# Patient Record
Sex: Male | Born: 1975 | Race: White | Hispanic: No | Marital: Married | Smoking: Former smoker
Health system: Southern US, Community
[De-identification: ages and names within clinical notes are randomized; demographics above are authoritative.]

## PROBLEM LIST (undated history)

## (undated) DIAGNOSIS — J45909 Unspecified asthma, uncomplicated: Secondary | ICD-10-CM

## (undated) DIAGNOSIS — K219 Gastro-esophageal reflux disease without esophagitis: Secondary | ICD-10-CM

## (undated) DIAGNOSIS — T7840XA Allergy, unspecified, initial encounter: Secondary | ICD-10-CM

## (undated) DIAGNOSIS — G8929 Other chronic pain: Secondary | ICD-10-CM

## (undated) HISTORY — DX: Allergy, unspecified, initial encounter: T78.40XA

## (undated) HISTORY — DX: Unspecified asthma, uncomplicated: J45.909

## (undated) HISTORY — PX: HERNIA REPAIR: SHX51

## (undated) HISTORY — DX: Other chronic pain: G89.29

## (undated) HISTORY — DX: Gastro-esophageal reflux disease without esophagitis: K21.9

## (undated) HISTORY — PX: EYE SURGERY: SHX253

---

## 2016-02-21 ENCOUNTER — Encounter: Payer: Self-pay | Admitting: Family Medicine

## 2016-02-21 ENCOUNTER — Ambulatory Visit (INDEPENDENT_AMBULATORY_CARE_PROVIDER_SITE_OTHER): Payer: BLUE CROSS/BLUE SHIELD | Admitting: Family Medicine

## 2016-02-21 VITALS — BP 116/80 | HR 89 | Temp 98.5°F | Ht 72.0 in | Wt 189.8 lb

## 2016-02-21 DIAGNOSIS — L255 Unspecified contact dermatitis due to plants, except food: Secondary | ICD-10-CM | POA: Diagnosis not present

## 2016-02-21 DIAGNOSIS — Z7189 Other specified counseling: Secondary | ICD-10-CM

## 2016-02-21 DIAGNOSIS — Z7689 Persons encountering health services in other specified circumstances: Secondary | ICD-10-CM

## 2016-02-21 MED ORDER — PREDNISONE 20 MG PO TABS: ORAL_TABLET | ORAL | Status: DC

## 2016-02-21 NOTE — Patient Instructions (Signed)
You appear to have poison ivy or anther type of plant dermatitis Use the prednisone as directed. You can continue to use calamine or topical steroid creams, benadryl and zyrtec as needed Let me know if you are not improving in the next couple of days- Sooner if worse.   Take zyrtec in the am and benadryl before bed- using these at the same time may cause more sedation then using them separately

## 2016-02-21 NOTE — Progress Notes (Signed)
Pre visit review using our clinic tool,if applicable. No additional management support is needed unless otherwise documented below in the visit note.  

## 2016-02-21 NOTE — Progress Notes (Signed)
[place] at [facility] [address] [place], [place] [address] [phone]  Date:  02/21/2016   Name:  Shawn Robbins   DOB:  Oct 15, 1976   MRN:  [id number]  PCP:  Lamar Blinks, MD    Chief Complaint: Establish Care   History of Present Illness:  Shawn Robbins is a 40 y.o. very pleasant male patient who presents with the following:  Generally healthy with history of GERD, EIA.   His heartburn started 3-4 years ago- he changed his diet a lot and his sx remitted.  He does take prevacid daily still.    He is from [place], moved to [facility] [place] [facility] about 9 years ago and is now in [place]. Most of his family has joined him here He has not had a PCP in some time but his insurance has changed so he will come here now.   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    His main concern today is a rash- he did yardwork over the weekend and pulled some vines. 2 days later he noted onset of a rash on his right arm, now has spread to his neck and trunk.  He otherwise feels well- no fever, no SOB, no lip or tongue swelling, no wheezing  He does take zyrtec daily for AR   There are no active problems to display for this patient.   Past Medical History  Diagnosis Date  . Allergy   . Asthma     Athletic induced  . GERD (gastroesophageal reflux disease)     Past Surgical History  Procedure Laterality Date  . Hernia repair    . Eye surgery      Left eye as a child    Social History  Substance Use Topics  . Smoking status: Never Smoker   . Smokeless tobacco: Never Used  . Alcohol Use: 0.0 oz/week    0 Standard drinks or equivalent per week    Family History  Problem Relation Age of Onset  . Heart disease Mother   . Muscular dystrophy Mother   . Asthma Mother   . Diabetes Mother   . Cancer Father   . Parkinson's disease Father     No Known Allergies  Medication list has been reviewed and updated.  No current outpatient  prescriptions on file prior to visit.   No current facility-administered medications on file prior to visit.    Review of Systems:  As per HPI- otherwise negative. The rash is not painful It does itch enough that he wants to use prednisone to try and clear this up    Physical Examination: Filed Vitals:   02/21/16 1316  BP: 116/80  Pulse: 89  Temp: 98.5 F (36.9 C)   Filed Vitals:   02/21/16 1316  Height: 6' (1.829 m)  Weight: 189 lb 12.8 oz (86.093 kg)   Body mass index is 25.74 kg/(m^2). Ideal Body Weight: Weight in (lb) to have BMI = 25: 183.9  GEN: WDWN, NAD, Non-toxic, A & O x 3, looks well HEENT: Atraumatic, Normocephalic. Neck supple. No masses, No LAD.  Bilateral TM wnl, oropharynx normal.  PEERL,EOMI.   Ears and Nose: No external deformity. CV: RRR, No M/G/R. No JVD. No thrill. No extra heart sounds. PULM: CTA B, no wheezes, crackles, rhonchi. No retractions. No resp. distress. No accessory muscle use. EXTR: No c/c/e NEURO Normal gait.  PSYCH: Normally interactive. Conversant. Not depressed or  anxious appearing.  Calm demeanor.  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 He has a slightly more diffuse fine vesicular rash on his abdomen but this also does appear to be rhus derm  Assessment and Plan: Rhus dermatitis - Plan: predniSONE (DELTASONE) 20 MG tablet  Establishing care with new doctor, encounter for  Treat for symptomatic rhus derm with prednisone and antihistamines, topicals as needed He will see [place] for a CPE soon  See patient instructions for more details.    Meds ordered this encounter  Medications  . predniSONE (DELTASONE) 20 MG tablet    Sig: Take 2 pills a day for 5 days, then take 1 pill a day for 5 days    Dispense:  15 tablet    Refill:  0     Signed Lamar Blinks, MD

## 2016-08-13 ENCOUNTER — Ambulatory Visit (INDEPENDENT_AMBULATORY_CARE_PROVIDER_SITE_OTHER): Payer: BLUE CROSS/BLUE SHIELD | Admitting: Family Medicine

## 2016-08-13 ENCOUNTER — Encounter: Payer: Self-pay | Admitting: Family Medicine

## 2016-08-13 VITALS — BP 117/85 | HR 83 | Temp 98.9°F | Ht 72.0 in | Wt 187.0 lb

## 2016-08-13 DIAGNOSIS — H9209 Otalgia, unspecified ear: Secondary | ICD-10-CM | POA: Diagnosis not present

## 2016-08-13 DIAGNOSIS — R05 Cough: Secondary | ICD-10-CM | POA: Diagnosis not present

## 2016-08-13 DIAGNOSIS — J029 Acute pharyngitis, unspecified: Secondary | ICD-10-CM

## 2016-08-13 DIAGNOSIS — R058 Other specified cough: Secondary | ICD-10-CM

## 2016-08-13 LAB — CBC WITH DIFFERENTIAL/PLATELET
BASOS ABS: 0 10*3/uL (ref 0.0–0.1)
Basophils Relative: 0.6 % (ref 0.0–3.0)
EOS ABS: 0 10*3/uL (ref 0.0–0.7)
Eosinophils Relative: 0.6 % (ref 0.0–5.0)
HCT: 47.7 % (ref 39.0–52.0)
HEMOGLOBIN: 16.5 g/dL (ref 13.0–17.0)
Lymphocytes Relative: 39.5 % (ref 12.0–46.0)
Lymphs Abs: 1.9 10*3/uL (ref 0.7–4.0)
MCHC: 34.5 g/dL (ref 30.0–36.0)
MCV: 91.6 fl (ref 78.0–100.0)
Monocytes Absolute: 0.7 10*3/uL (ref 0.1–1.0)
Monocytes Relative: 14.7 % — ABNORMAL HIGH (ref 3.0–12.0)
Neutro Abs: 2.2 10*3/uL (ref 1.4–7.7)
Neutrophils Relative %: 44.6 % (ref 43.0–77.0)
PLATELETS: 278 10*3/uL (ref 150.0–400.0)
RBC: 5.21 Mil/uL (ref 4.22–5.81)
RDW: 13.1 % (ref 11.5–15.5)
WBC: 4.9 10*3/uL (ref 4.0–10.5)

## 2016-08-13 LAB — POCT RAPID STREP A (OFFICE): Rapid Strep A Screen: NEGATIVE

## 2016-08-13 NOTE — Progress Notes (Signed)
[place] at [facility] [address] [place], [place] [address] [phone]  Date:  08/13/2016   Name:  Shawn Robbins   DOB:  June 20, 1976   MRN:  [id number]  PCP:  Lamar Blinks, MD    Chief Complaint: Otalgia (c/o bilateral ear pressure, mostly dry cough, sore throat, head pressure and mild headache and hoarseness. Sx's started this past Sunday. )   History of Present Illness:  Shawn Robbins is a 40 y.o. very pleasant male patient who presents with the following:  History of allergies and occasional asthma sx Here today with illness.  He first noted ear pressure and ST.  The ST went away, but he continued to have ear pain, cough, hoarse voice. He had a low grade temp yesterday to 99.8.   The left ear is more painful at this time His cough is dry He does have a history of seasonal allergies No aches or chills No GI symptoms.   He has tried some zyrtec, flonase, he takes prevacid.  No wheezing.  They are leaving for a vacation this weekend- will be camping.   His wife reports that he coughed a lot last night but he slept through it.    There are no active problems to display for this patient.   Past Medical History:  Diagnosis Date  . Allergy   . Asthma    Athletic induced  . GERD (gastroesophageal reflux disease)     Past Surgical History:  Procedure Laterality Date  . EYE SURGERY     Left eye as a child  . HERNIA REPAIR      Social History  Substance Use Topics  . Smoking status: Former Research scientist (life sciences)  . Smokeless tobacco: Never Used  . Alcohol use 0.0 oz/week    Family History  Problem Relation Age of Onset  . Heart disease Mother   . Muscular dystrophy Mother   . Asthma Mother   . Diabetes Mother   . Cancer Father   . Parkinson's disease Father     No Known Allergies  Medication list has been reviewed and updated.  No current outpatient prescriptions on file prior to visit.   No current  facility-administered medications on file prior to visit.     Review of Systems:  As per HPI- otherwise negative.   Physical Examination: Vitals:   08/13/16 1028  BP: 117/85  Pulse: 83  Temp: 98.9 F (37.2 C)   Vitals:   08/13/16 1028  Weight: 187 lb (84.8 kg)  Height: 6' (1.829 m)   Body mass index is 25.36 kg/m. Ideal Body Weight: Weight in (lb) to have BMI = 25: 183.9  GEN: WDWN, NAD, Non-toxic, A & O x 3, looks well HEENT: Atraumatic, Normocephalic. Neck supple. No masses, No LAD.  Bilateral TM wnl, oropharynx erythematous without any exudate.  PEERL,EOMI.   Ears are clear Ears and Nose: No external deformity. CV: RRR, No M/G/R. No JVD. No thrill. No extra heart sounds. PULM: CTA B, no wheezes, crackles, rhonchi. No retractions. No resp. distress. No accessory muscle use. EXTR: No c/c/e NEURO Normal gait.  PSYCH: Normally interactive. Conversant. Not depressed or anxious appearing.  Calm demeanor.   Results for orders placed or performed in visit on 08/13/16  POCT rapid strep A  Result Value Ref Range   Rapid Strep A Screen Negative Negative    Assessment and Plan: Acute pharyngitis, unspecified etiology - Plan: POCT rapid strep A, CBC with  Differential/Platelet  Dry cough - Plan: CBC with Differential/Platelet  Earache - Plan: CBC with Differential/Platelet  Here today with a likely viral illness.  He is going on a camping trip tomorrow however so we will check a CBC for any evidence of a bacterial infection  Signed Lamar Blinks, MD  Received his CBC and gave him a call- this does appear to be viral. Continue supportive measures and call me if any worsening or other concerns, or if not improving soon  Results for orders placed or performed in visit on 08/13/16  CBC with Differential/Platelet  Result Value Ref Range   WBC 4.9 4.0 - 10.5 K/uL   RBC 5.21 4.22 - 5.81 Mil/uL   Hemoglobin 16.5 13.0 - 17.0 g/dL   HCT 47.7 39.0 - 52.0 %   MCV 91.6 78.0 -  100.0 fl   MCHC 34.5 30.0 - 36.0 g/dL   RDW 13.1 11.5 - 15.5 %   Platelets 278.0 150.0 - 400.0 K/uL   Neutrophils Relative % 44.6 43.0 - 77.0 %   Lymphocytes Relative 39.5 12.0 - 46.0 %   Monocytes Relative 14.7 (H) 3.0 - 12.0 %   Eosinophils Relative 0.6 0.0 - 5.0 %   Basophils Relative 0.6 0.0 - 3.0 %   Neutro Abs 2.2 1.4 - 7.7 K/uL   Lymphs Abs 1.9 0.7 - 4.0 K/uL   Monocytes Absolute 0.7 0.1 - 1.0 K/uL   Eosinophils Absolute 0.0 0.0 - 0.7 K/uL   Basophils Absolute 0.0 0.0 - 0.1 K/uL  POCT rapid strep A  Result Value Ref Range   Rapid Strep A Screen Negative Negative

## 2016-08-13 NOTE — Patient Instructions (Signed)
I think that you have a viral illness, but we will do a blood count to ensure no sign of a bacterial infection prior to your trip.  I'll be in touch with this later on today

## 2016-08-13 NOTE — Progress Notes (Signed)
Pre visit review using our clinic review tool, if applicable. No additional management support is needed unless otherwise documented below in the visit note. 

## 2016-10-30 ENCOUNTER — Other Ambulatory Visit: Payer: Self-pay | Admitting: Family Medicine

## 2016-10-30 ENCOUNTER — Ambulatory Visit (INDEPENDENT_AMBULATORY_CARE_PROVIDER_SITE_OTHER): Payer: BLUE CROSS/BLUE SHIELD | Admitting: Family Medicine

## 2016-10-30 ENCOUNTER — Encounter: Payer: Self-pay | Admitting: Family Medicine

## 2016-10-30 ENCOUNTER — Ambulatory Visit (HOSPITAL_BASED_OUTPATIENT_CLINIC_OR_DEPARTMENT_OTHER)
Admission: RE | Admit: 2016-10-30 | Discharge: 2016-10-30 | Disposition: A | Discharge status: Home / Self Care | Payer: BLUE CROSS/BLUE SHIELD | Source: Ambulatory Visit | Attending: Family Medicine | Admitting: Family Medicine

## 2016-10-30 VITALS — BP 129/86 | HR 94 | Temp 98.5°F | Ht 72.0 in | Wt 198.4 lb

## 2016-10-30 DIAGNOSIS — G8929 Other chronic pain: Secondary | ICD-10-CM | POA: Insufficient documentation

## 2016-10-30 DIAGNOSIS — M25641 Stiffness of right hand, not elsewhere classified: Secondary | ICD-10-CM

## 2016-10-30 DIAGNOSIS — Z23 Encounter for immunization: Secondary | ICD-10-CM | POA: Diagnosis not present

## 2016-10-30 DIAGNOSIS — Z131 Encounter for screening for diabetes mellitus: Secondary | ICD-10-CM | POA: Diagnosis not present

## 2016-10-30 DIAGNOSIS — R109 Unspecified abdominal pain: Secondary | ICD-10-CM

## 2016-10-30 DIAGNOSIS — M2560 Stiffness of unspecified joint, not elsewhere classified: Secondary | ICD-10-CM | POA: Diagnosis not present

## 2016-10-30 DIAGNOSIS — M2352 Chronic instability of knee, left knee: Secondary | ICD-10-CM | POA: Diagnosis not present

## 2016-10-30 DIAGNOSIS — Z13220 Encounter for screening for lipoid disorders: Secondary | ICD-10-CM

## 2016-10-30 DIAGNOSIS — R740 Nonspecific elevation of levels of transaminase and lactic acid dehydrogenase [LDH]: Secondary | ICD-10-CM

## 2016-10-30 DIAGNOSIS — M25562 Pain in left knee: Secondary | ICD-10-CM

## 2016-10-30 DIAGNOSIS — R635 Abnormal weight gain: Secondary | ICD-10-CM

## 2016-10-30 DIAGNOSIS — R7401 Elevation of levels of liver transaminase levels: Secondary | ICD-10-CM

## 2016-10-30 LAB — TSH: TSH: 1.13 u[IU]/mL (ref 0.35–4.50)

## 2016-10-30 LAB — LIPID PANEL
CHOL/HDL RATIO: 5
CHOLESTEROL: 268 mg/dL — AB (ref 0–200)
HDL: 54.8 mg/dL (ref >39.00)
LDL CALC: 181 mg/dL — AB (ref 0–99)
NonHDL: 212.8
Triglycerides: 158 mg/dL — ABNORMAL HIGH (ref 0.0–149.0)
VLDL: 31.6 mg/dL (ref 0.0–40.0)

## 2016-10-30 LAB — COMPREHENSIVE METABOLIC PANEL
ALBUMIN: 4.6 g/dL (ref 3.5–5.2)
ALT: 200 U/L — ABNORMAL HIGH (ref 0–53)
AST: 95 U/L — AB (ref 0–37)
Alkaline Phosphatase: 124 U/L — ABNORMAL HIGH (ref 39–117)
BUN: 12 mg/dL (ref 6–23)
CHLORIDE: 103 meq/L (ref 96–112)
CO2: 32 meq/L (ref 19–32)
CREATININE: 1.14 mg/dL (ref 0.40–1.50)
Calcium: 9.5 mg/dL (ref 8.4–10.5)
GFR: 75.24 mL/min (ref >60.00)
Glucose, Bld: 92 mg/dL (ref 70–99)
POTASSIUM: 4 meq/L (ref 3.5–5.1)
SODIUM: 141 meq/L (ref 135–145)
Total Bilirubin: 0.7 mg/dL (ref 0.2–1.2)
Total Protein: 7.2 g/dL (ref 6.0–8.3)

## 2016-10-30 LAB — SEDIMENTATION RATE: Sed Rate: 2 mm/hr (ref 0–15)

## 2016-10-30 LAB — C-REACTIVE PROTEIN: CRP: 0.1 mg/dL — AB (ref 0.5–20.0)

## 2016-10-30 NOTE — Patient Instructions (Signed)
Please go to lab and then to x-ray (on the ground floor, get off elevator and turn left) I will be in touch with your labs and x-rays asap Assuming your knee film looks ok we might try some physical therapy for knee strength and stability.  If symptoms persist an MRI would likely be the next step- however you would only want to have an MRI if your symptoms are to the point that you would consider surgery  We will get plain films of your right hand- if these are normal and there is no sign of an auto-immune disorder I plan to have you see a hand specialist. Your palmar fascia seems tight and I want to make sure you are not developing a contracture of your hand.  They may also want to test for entrapment of your ulnar nerve  Finally your abdominal wall pain is likely muscular in origin.  If worsening please let me know. Otherwise core strengthening like planks may be helpful in getting rid of this symptom.   Will also make sure that your thyroid is normal due to weight gain

## 2016-10-30 NOTE — Progress Notes (Addendum)
[place] at [facility] [address] [place], [place] [address] [phone]  Date:  10/30/2016   Name:  Shawn Robbins   DOB:  Mar 19, 1976   MRN:  [id number]  PCP:  Lamar Blinks, MD    Chief Complaint: No chief complaint on file.   History of Present Illness:  Shawn Robbins is a 40 y.o. very pleasant male patient who presents with the following:  History of allergies and asthma (exercise induced)- otherwise generally in good health. Here today with complaint of right hand pain which he has noticed for about one year overall, worse for 3-4 months The pain is worse in the long, ring and pinky fingers. No thumb pain He has noted a little tingling in the tip of the right long finger. When he has the pain the hand does feel weaker- however this will come and go.  The hand feels stiff.   The left hand is fine He is right handed He cannot recall any other injury.  He has not really tried any meds except for advil. He has not really noted any change with the weather  Also his left knee has been painful off an on for 4-5 months. It does not hurt "a lot, but when I go to walk up the stairs it will ache and can feel weak."  It can feel unstable, no clicking or popping that he has noted.   The knee is not really stiff if he sits for a long time.   No falls He is not aware of any injury here Right knee is ok.    He also has complaint of a right sided "muscle spasm" that can occur if he flexes the right hip such as when he lifts his leg to to put on socks and shoes. May last for a minute or two. He will stretch and relax the muscles and they relax.   He has not noted any abdominal or groin bulges He has noted some weight gain over the last year  Wt Readings from Last 3 Encounters:  10/30/16 198 lb 6.4 oz (90 kg)  08/13/16 187 lb (84.8 kg)  02/21/16 189 lb 12.8 oz (86.1 kg)    No fever, no bowel changes. He does get a dry throat some  of the time.    He is fasting today. Just coffee.    Flu shot today Former smoker  There are no active problems to display for this patient.   Past Medical History:  Diagnosis Date  . Allergy   . Asthma    Athletic induced  . GERD (gastroesophageal reflux disease)     Past Surgical History:  Procedure Laterality Date  . EYE SURGERY     Left eye as a child  . HERNIA REPAIR      Social History  Substance Use Topics  . Smoking status: Former Research scientist (life sciences)  . Smokeless tobacco: Never Used  . Alcohol use 0.0 oz/week    Family History  Problem Relation Age of Onset  . Heart disease Mother   . Muscular dystrophy Mother   . Asthma Mother   . Diabetes Mother   . Cancer Father   . Parkinson's disease Father     No Known Allergies  Medication list has been reviewed and updated.  Current Outpatient Prescriptions on File Prior to Visit  Medication Sig Dispense Refill  . cetirizine (ZYRTEC) 10 MG tablet Take 10 mg by mouth daily.    Marland Kitchen  fluticasone (FLONASE) 50 MCG/ACT nasal spray Place 2 sprays into both nostrils daily as needed.    . lansoprazole (PREVACID) 30 MG capsule Take 30 mg by mouth daily.     No current facility-administered medications on file prior to visit.     Review of Systems:  As per HPI- otherwise negative.   Physical Examination: Blood pressure 129/86, pulse 94, temperature 98.5 F (36.9 C), temperature source Oral, height 6' (1.829 m), weight 198 lb 6.4 oz (90 kg), SpO2 98 %. Body mass index is 26.91 kg/m.  GEN: WDWN, NAD, Non-toxic, A & O x 3, looks well, has gained a little weight HEENT: Atraumatic, Normocephalic. Neck supple. No masses, No LAD.  Bilateral TM wnl, oropharynx normal.  PEERL,EOMI.   Ears and Nose: No external deformity. CV: RRR, No M/G/R. No JVD. No thrill. No extra heart sounds. PULM: CTA B, no wheezes, crackles, rhonchi. No retractions. No resp. distress. No accessory muscle use. ABD: S, NT, N. No rebound. No HSM. EXTR: No  c/c/e NEURO Normal gait.  PSYCH: Normally interactive. Conversant. Not depressed or anxious appearing.  Calm demeanor.  Right hand: normal joints. However the palmar fascia does seem a bit tight- he is just able to fully extend the fingers.  Normal strength of the hand.  He notes a strange feeling with percussion over the carpal tunnel.  Wrist and elbow ok.  Left palmar fascia is also snug but less so  Left knee:normla ROM, feels stable, no effusion or heat, no redness.  He notes tenderness at the interior patellar tendon at tibia insertion Abdominal wall: normal exam, no defect noted on resisted hip flexion or partial sit-up. No bulge or tenderness on exam  Assessment and Plan:  Stiffness of right hand joint - Plan: DG Hand Complete Right  Need for influenza vaccination - Plan: Flu Vaccine QUAD 36+ mos IM (Fluarix & Fluzone Quad PF  Chronic pain of left knee - Plan: DG Knee Complete 4 Views Left  Chronic knee instability, left - Plan: DG Knee Complete 4 Views Left  Screening for diabetes mellitus - Plan: Comprehensive metabolic panel  Screening for hyperlipidemia - Plan: Lipid panel  Joint stiffness - Plan: Sed Rate (ESR), C-reactive protein, Rheumatoid Factor  Abdominal wall pain  Weight gain - Plan: TSH   Please go to lab and then to x-ray (on the ground floor, get off elevator and turn left) I will be in touch with your labs and x-rays asap Assuming your knee film looks ok we might try some physical therapy for knee strength and stability.  If symptoms persist an MRI would likely be the next step- however you would only want to have an MRI if your symptoms are to the point that you would consider surgery  We will get plain films of your right hand- if these are normal and there is no sign of an auto-immune disorder I plan to have you see a hand specialist. Your palmar fascia seems tight and I want to make sure you are not developing a contracture of your hand.  They may also want  to test for entrapment of your ulnar nerve  Finally your abdominal wall pain is likely muscular in origin.  If worsening please let me know. Otherwise core strengthening like planks may be helpful in getting rid of this symptom.   Will also make sure that your thyroid is normal due to weight gain   Signed Lamar Blinks, MD  Called pt 12/29 to go over labs  and films.  X-rays are normal.  Labs are ok excpet his LFTs are up.  He does not recall being told this in the past and there are no old labs on epic.  He does not take a significant amount of tylenol and does not drink what he considers to be more than occasionally.  I have asked to add on a hepatitis panel and will also order an [place] asap.  He will avoid tyelnol and etoh.   Also recommended moderate weight loss/ dietary modification to lower LDL.   Follow-up pending [place]  Dg Knee Complete 4 Views Left  Result Date: 10/30/2016 CLINICAL DATA:  Pain and stiffness EXAM: LEFT KNEE - COMPLETE 4+ VIEW COMPARISON:  None. FINDINGS: Frontal, lateral, and bilateral oblique views were obtained. There is no fracture or dislocation. No joint effusion. The joint spaces appear normal. No erosive change. IMPRESSION: No fracture or dislocation.  No evident arthropathy. Electronically Signed   By: Lowella Grip III M.D.   On: 10/30/2016 10:39   Dg Hand Complete Right  Result Date: 10/30/2016 CLINICAL DATA:  Pain and stiffness EXAM: RIGHT HAND - COMPLETE 3+ VIEW COMPARISON:  None. FINDINGS: Frontal, oblique, and lateral views were obtained. There is no fracture or dislocation. The joint spaces appear normal. No erosive change. IMPRESSION: No fracture or dislocation.  No appreciable arthropathy. Electronically Signed   By: Lowella Grip III M.D.   On: 10/30/2016 10:38   Results for orders placed or performed in visit on 10/30/16  Comprehensive metabolic panel  Result Value Ref Range   Sodium 141 135 - 145 mEq/L   Potassium 4.0 3.5 - 5.1 mEq/L    Chloride 103 96 - 112 mEq/L   CO2 32 19 - 32 mEq/L   Glucose, Bld 92 70 - 99 mg/dL   BUN 12 6 - 23 mg/dL   Creatinine, Ser 1.14 0.40 - 1.50 mg/dL   Total Bilirubin 0.7 0.2 - 1.2 mg/dL   Alkaline Phosphatase 124 (H) 39 - 117 U/L   AST 95 (H) 0 - 37 U/L   ALT 200 (H) 0 - 53 U/L   Total Protein 7.2 6.0 - 8.3 g/dL   Albumin 4.6 3.5 - 5.2 g/dL   Calcium 9.5 8.4 - 10.5 mg/dL   GFR 75.24 >60.00 mL/min  Lipid panel  Result Value Ref Range   Cholesterol 268 (H) 0 - 200 mg/dL   Triglycerides 158.0 (H) 0.0 - 149.0 mg/dL   HDL 54.80 >39.00 mg/dL   VLDL 31.6 0.0 - 40.0 mg/dL   LDL Cholesterol 181 (H) 0 - 99 mg/dL   Total CHOL/HDL Ratio 5    NonHDL 212.80   Sed Rate (ESR)  Result Value Ref Range   Sed Rate 2 0 - 15 mm/hr  C-reactive protein  Result Value Ref Range   CRP 0.1 (L) 0.5 - 20.0 mg/dL  Rheumatoid Factor  Result Value Ref Range   Rhuematoid fact SerPl-aCnc <14 <14 IU/mL  TSH  Result Value Ref Range   TSH 1.13 0.35 - 4.50 uIU/mL

## 2016-10-31 ENCOUNTER — Encounter: Payer: Self-pay | Admitting: Family Medicine

## 2016-10-31 ENCOUNTER — Ambulatory Visit (HOSPITAL_BASED_OUTPATIENT_CLINIC_OR_DEPARTMENT_OTHER)
Admission: RE | Admit: 2016-10-31 | Discharge: 2016-10-31 | Disposition: A | Discharge status: Home / Self Care | Payer: BLUE CROSS/BLUE SHIELD | Source: Ambulatory Visit | Attending: Family Medicine | Admitting: Family Medicine

## 2016-10-31 DIAGNOSIS — R740 Nonspecific elevation of levels of transaminase and lactic acid dehydrogenase [LDH]: Secondary | ICD-10-CM | POA: Diagnosis present

## 2016-10-31 DIAGNOSIS — R7401 Elevation of levels of liver transaminase levels: Secondary | ICD-10-CM

## 2016-10-31 DIAGNOSIS — K760 Fatty (change of) liver, not elsewhere classified: Secondary | ICD-10-CM | POA: Diagnosis not present

## 2016-10-31 LAB — RHEUMATOID FACTOR: Rhuematoid fact SerPl-aCnc: <14 IU/mL (ref <14)

## 2016-10-31 NOTE — Addendum Note (Signed)
Addended by: Lamar Blinks C on: 10/31/2016 01:47 PM   Modules accepted: Orders

## 2016-11-01 ENCOUNTER — Other Ambulatory Visit: Payer: Self-pay | Admitting: Family Medicine

## 2016-11-01 DIAGNOSIS — R740 Nonspecific elevation of levels of transaminase and lactic acid dehydrogenase [LDH]: Principal | ICD-10-CM

## 2016-11-01 DIAGNOSIS — R7401 Elevation of levels of liver transaminase levels: Secondary | ICD-10-CM

## 2016-11-01 LAB — HEPATITIS PANEL, ACUTE
HCV AB: NEGATIVE
HEP A IGM: NONREACTIVE
HEP B S AG: NEGATIVE
Hep B C IgM: NONREACTIVE

## 2016-11-05 ENCOUNTER — Telehealth: Payer: Self-pay | Admitting: Family Medicine

## 2016-11-05 NOTE — Telephone Encounter (Signed)
Called him back to discuss further- he is concerned about the fatty appearance of his liver.  He did not drink really at all over the last several years, but over the last year he has averaged 14+ drinks per week.  This did not seem like a lot to him, but reading online he learned that this was in the moderate to heavy drinking category.  He has had no alcohol since we discussed his LFTs, and has no sx of withdrawal.   He will repeat LFTs in 2 weeks- if not improving we will look further

## 2016-11-05 NOTE — Telephone Encounter (Signed)
Relation to [id number] Call back number:[phone]   Reason for call:  Patient states he doesn't know how to send My Chart messages, advised patient how to utilize my chart messages and didn't voice understanding and would like to speak with PCP via telephone call regarding treatment plan, please advise

## 2016-11-18 ENCOUNTER — Other Ambulatory Visit (INDEPENDENT_AMBULATORY_CARE_PROVIDER_SITE_OTHER): Payer: BLUE CROSS/BLUE SHIELD

## 2016-11-18 DIAGNOSIS — R7401 Elevation of levels of liver transaminase levels: Secondary | ICD-10-CM

## 2016-11-18 DIAGNOSIS — R740 Nonspecific elevation of levels of transaminase and lactic acid dehydrogenase [LDH]: Secondary | ICD-10-CM | POA: Diagnosis not present

## 2016-11-21 LAB — HEPATIC FUNCTION PANEL
ALT: 40 U/L (ref 0–53)
AST: 25 U/L (ref 0–37)
Albumin: 4.8 g/dL (ref 3.5–5.2)
Alkaline Phosphatase: 102 U/L (ref 39–117)
Bilirubin, Direct: 0.1 mg/dL (ref 0.0–0.3)
Total Bilirubin: 0.5 mg/dL (ref 0.2–1.2)
Total Protein: 7.6 g/dL (ref 6.0–8.3)

## 2016-11-22 ENCOUNTER — Encounter: Payer: Self-pay | Admitting: Family Medicine

## 2017-07-22 IMAGING — US US ABDOMEN COMPLETE
1 series · 14 of 25 positions shown · non-contrast
Comparison: None.

CLINICAL DATA: Acute onset of intermittent right abdominal muscle
spasms. Elevated LFTs.

EXAM:
ABDOMEN ULTRASOUND COMPLETE

[Series 1: us abdomen complete · 0.20mm/px · 14 of 89 slices shown]
[im 1/89]
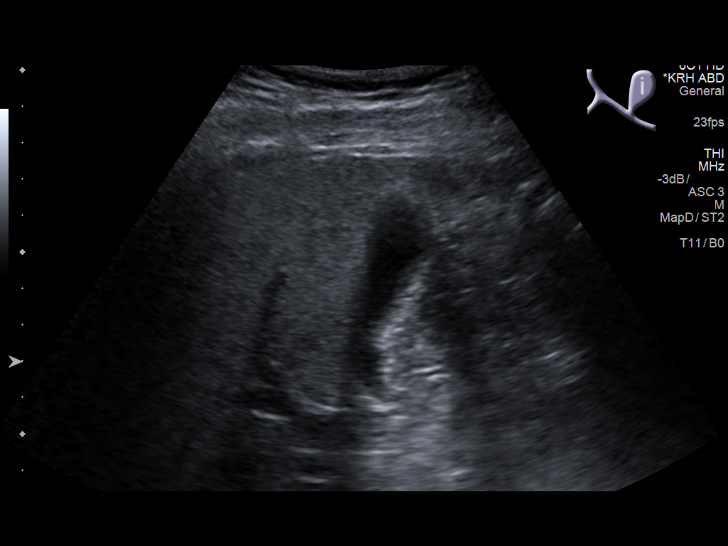
[im 8/89]
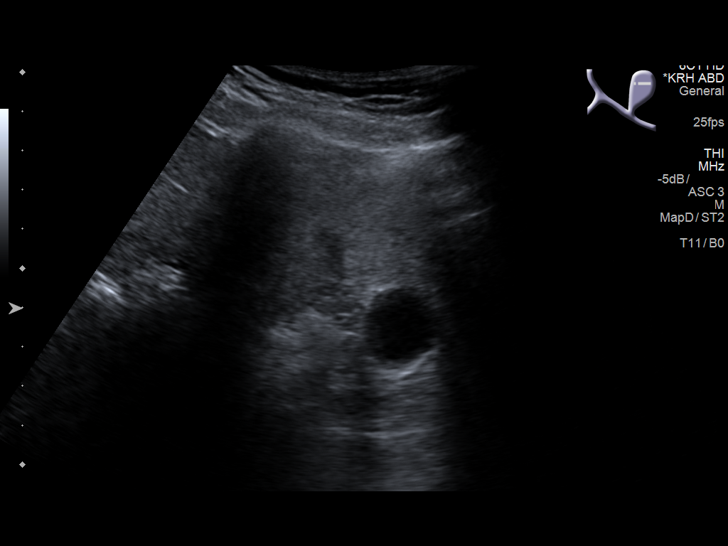
[im 15/89]
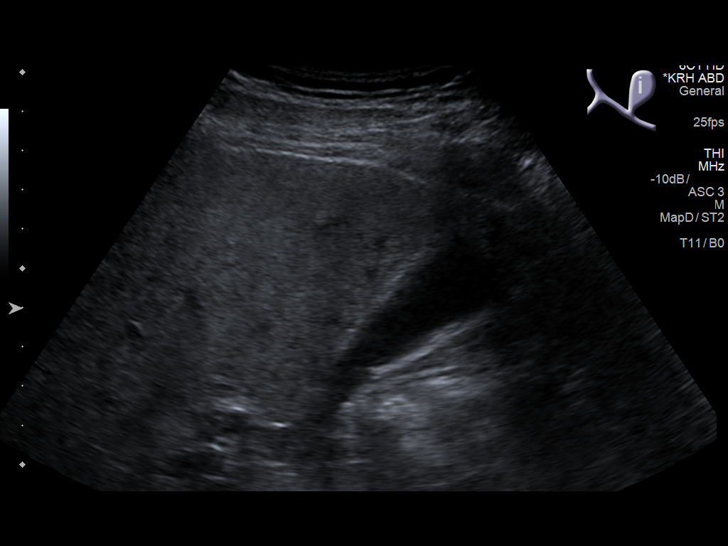
[im 23/89]
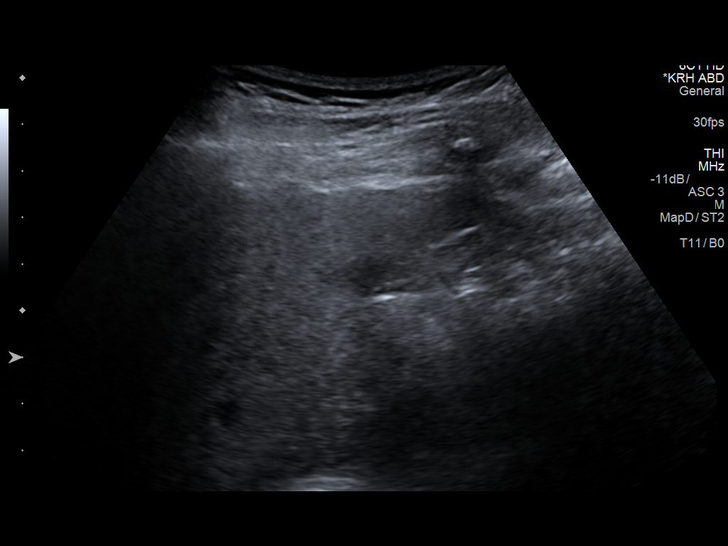
[im 30/89]
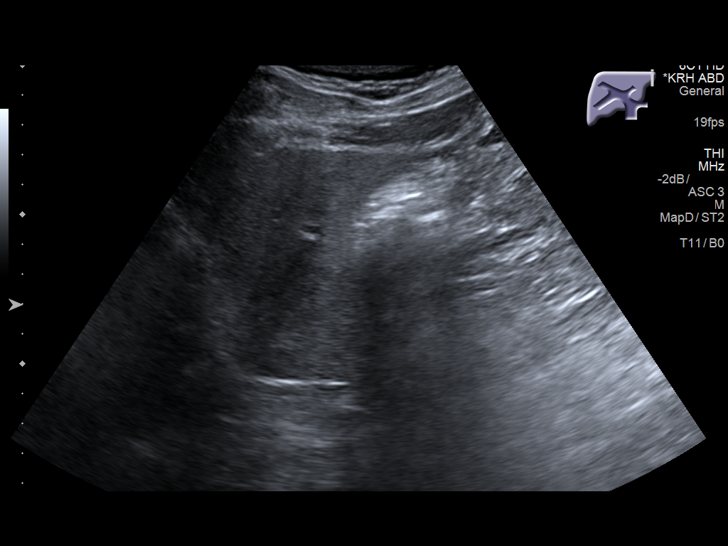
[im 34/89]
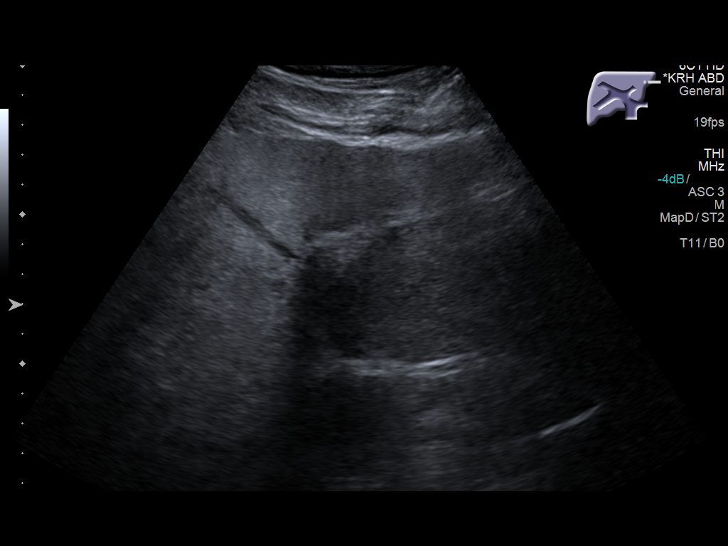
[im 41/89]
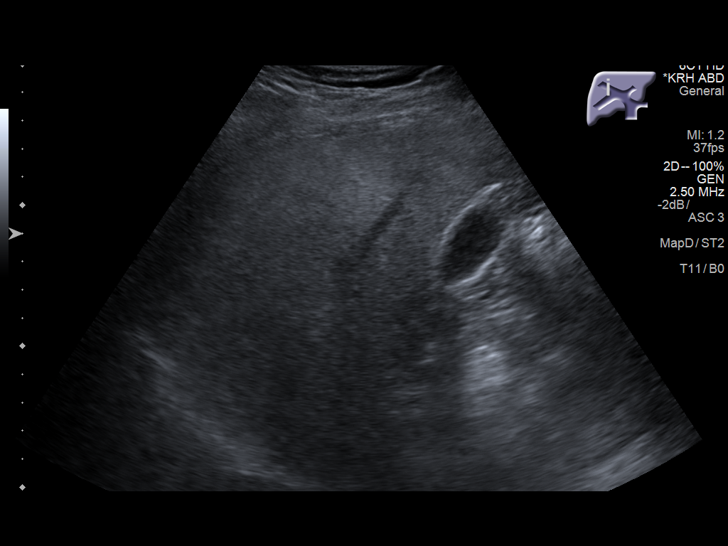
[im 48/89]
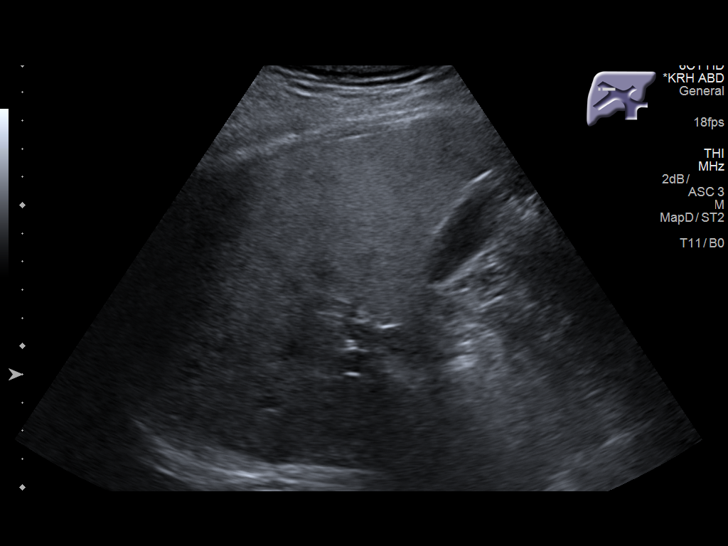
[im 56/89]
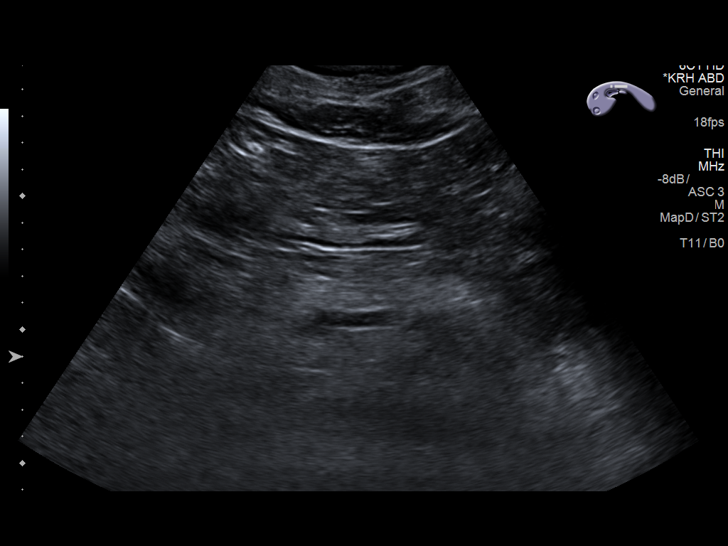
[im 59/89]
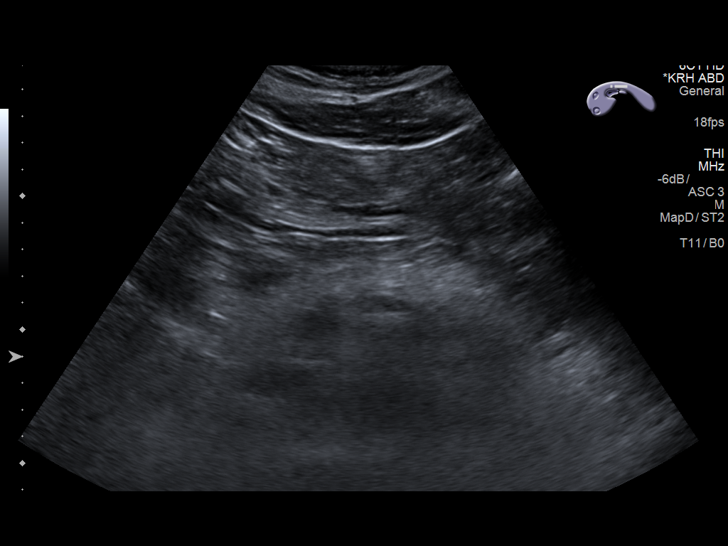
[im 67/89]
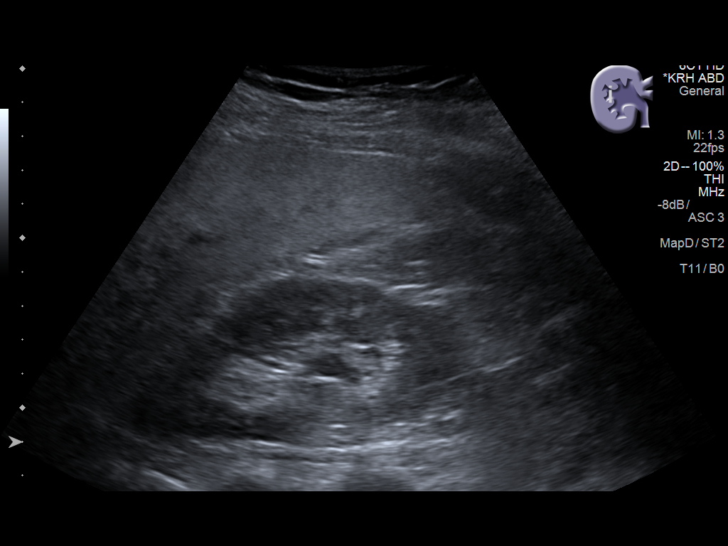
[im 74/89]
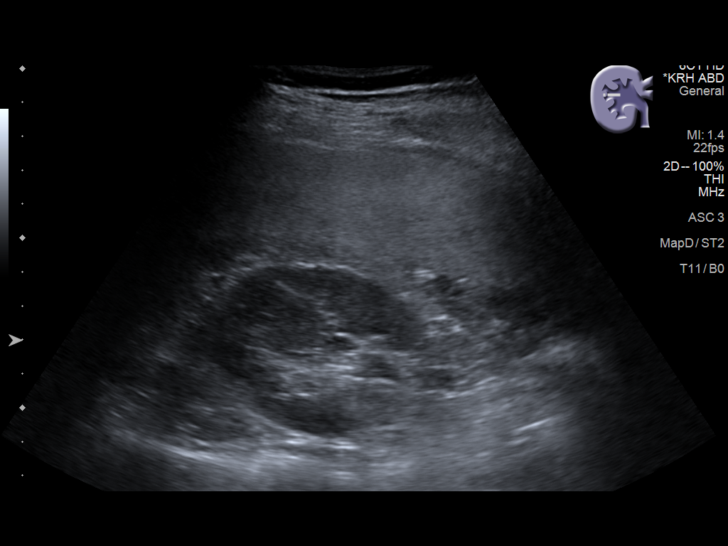
[im 81/89]
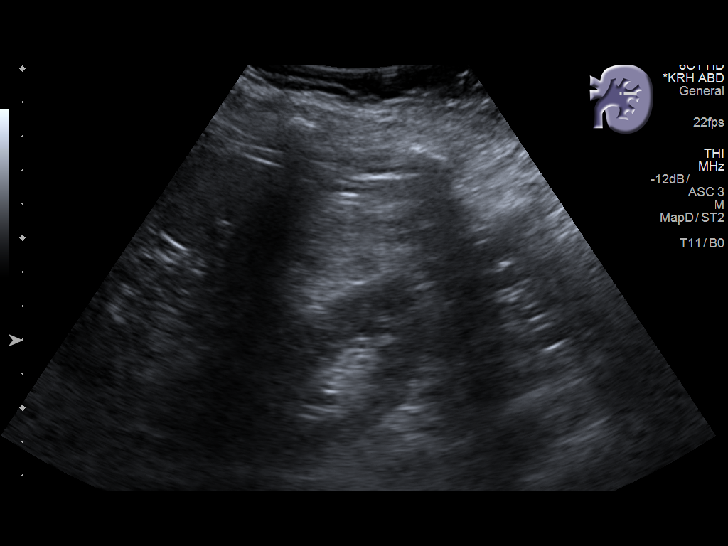
[im 89/89]
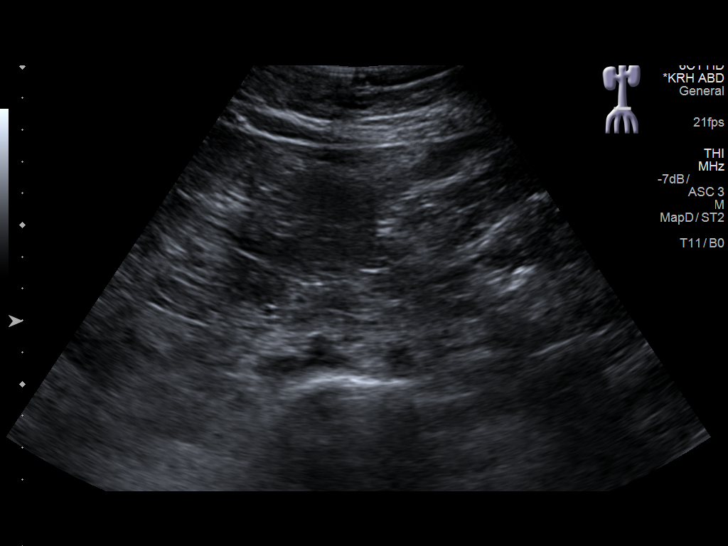

[14 of 25 positions shown; findings below may reference images not displayed]

FINDINGS: Gallbladder: No gallstones or wall thickening visualized. No
sonographic Murphy sign noted by sonographer.

Common bile duct: Diameter: 0.6 cm, within normal limits in caliber.

Liver: No focal lesion identified. Diffusely increased parenchymal
echogenicity is compatible with fatty infiltration, with focal
sparing noted adjacent to the gallbladder.

IVC: No abnormality visualized.

Pancreas: Visualized portion unremarkable.

Spleen: Size and appearance within normal limits.

Right Kidney: Length: 12.3 cm. Echogenicity within normal limits. No
mass or hydronephrosis visualized.

Left Kidney: Length: 11.4 cm. Echogenicity within normal limits. No
mass or hydronephrosis visualized.

Abdominal aorta: No aneurysm visualized. Partially obscured by
overlying bowel gas.

Other findings: None.
IMPRESSION: 1. No acute abnormality seen within the abdomen.
2. Diffuse fatty infiltration within the liver.

## 2018-06-13 IMAGING — CR DG HAND COMPLETE 3+V*R*
3 series · 3 of 3 positions shown · non-contrast
Comparison: None.

CLINICAL DATA: Pain and stiffness

EXAM:
RIGHT HAND - COMPLETE 3+ VIEW

[x hand pa right]
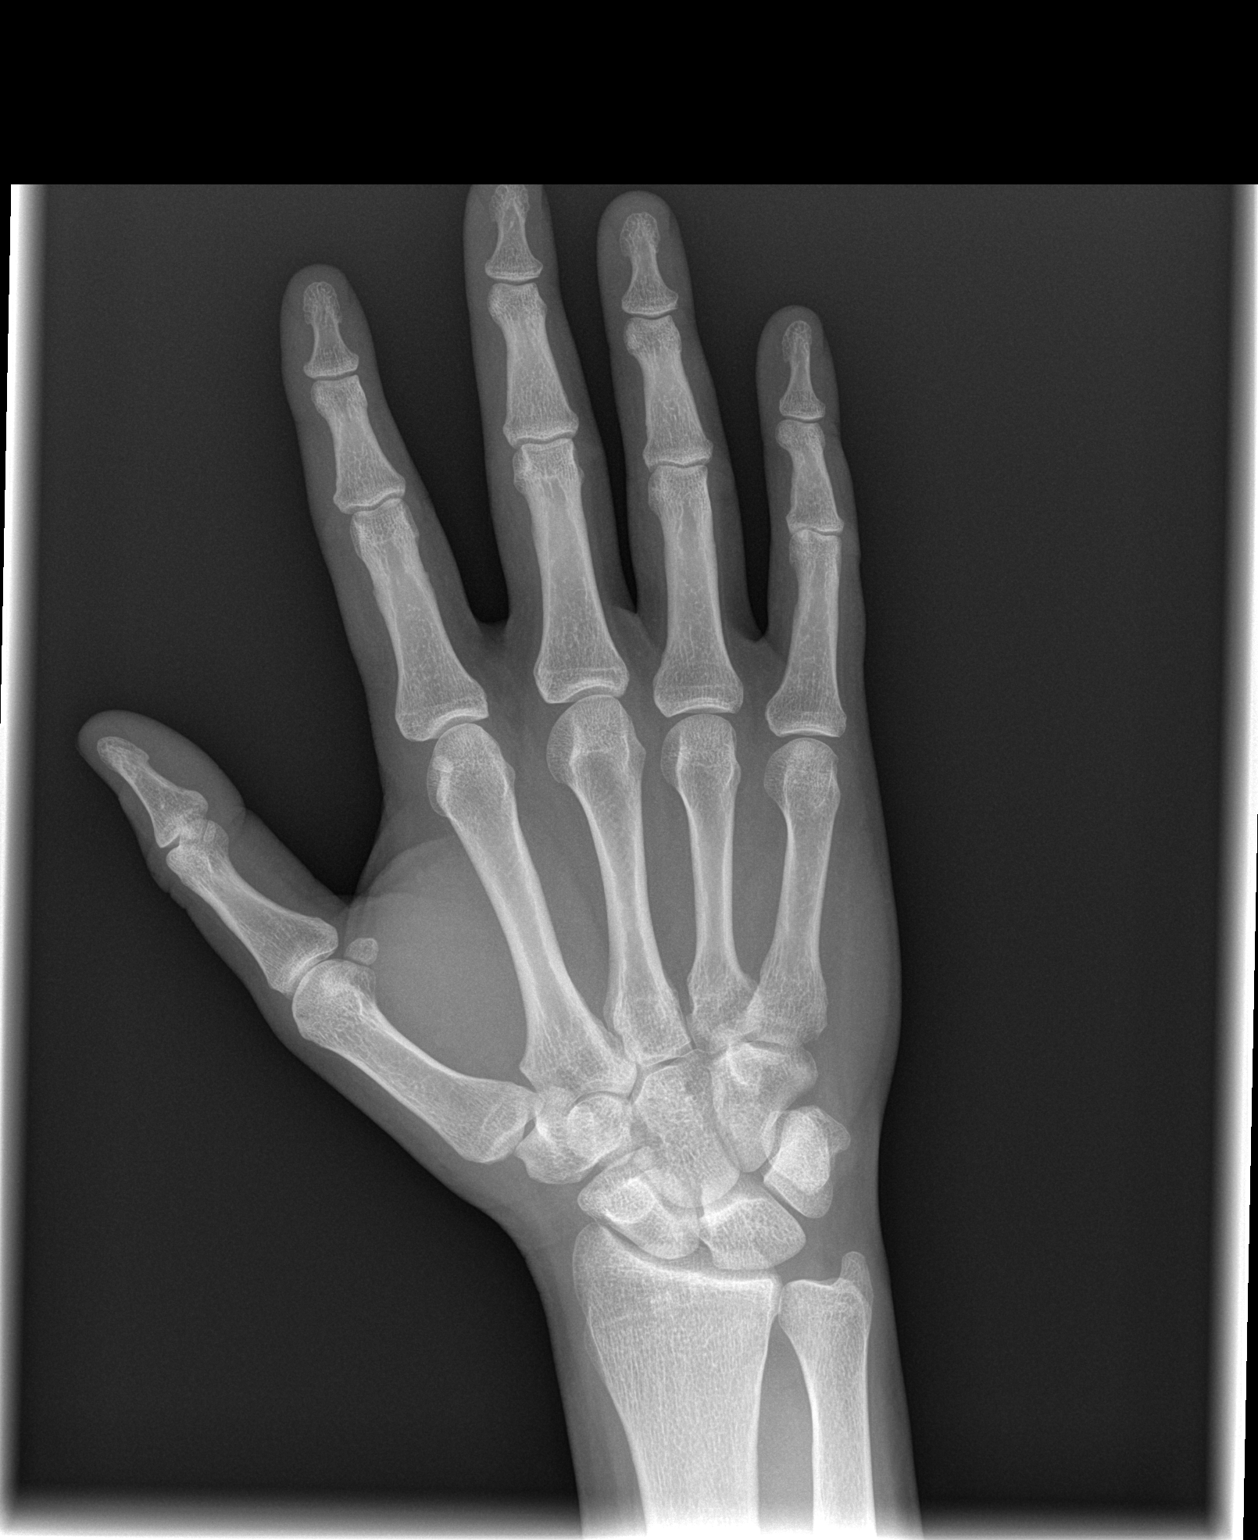

[x hand oblique right]
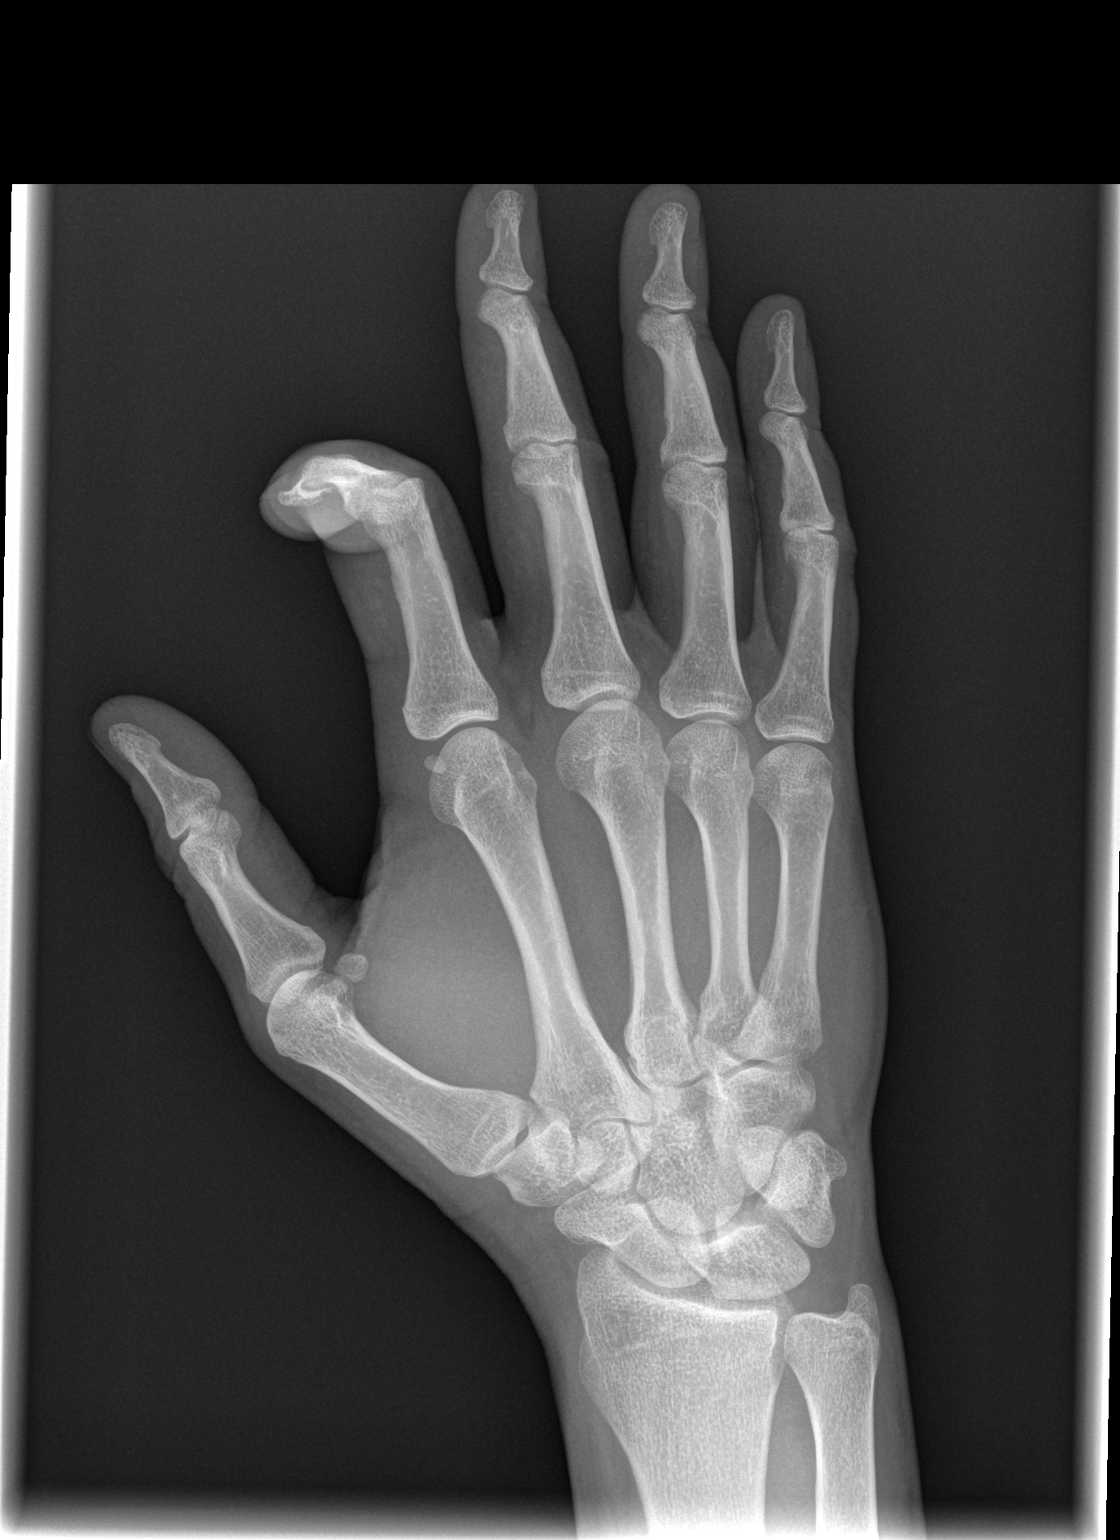

[x hand lat right]
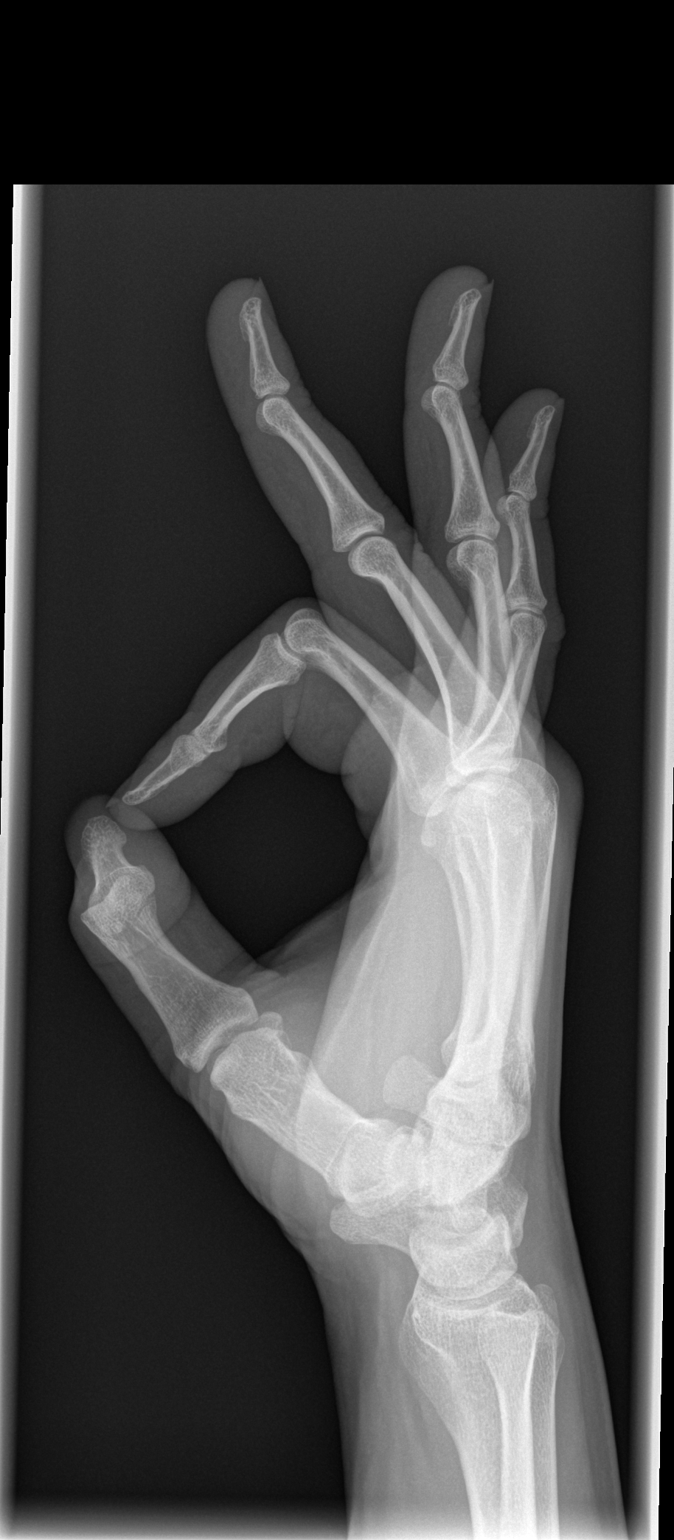

[3 of 3 positions shown; findings below may reference images not displayed]

FINDINGS: Frontal, oblique, and lateral views were obtained. There is no
fracture or dislocation. The joint spaces appear normal. No erosive
change.
IMPRESSION: No fracture or dislocation.  No appreciable arthropathy.

## 2018-06-13 IMAGING — CR DG KNEE COMPLETE 4+V*L*
4 series · 4 of 4 positions shown · non-contrast
Comparison: None.

CLINICAL DATA: Pain and stiffness

EXAM:
LEFT KNEE - COMPLETE 4+ VIEW

[t knee ap left]
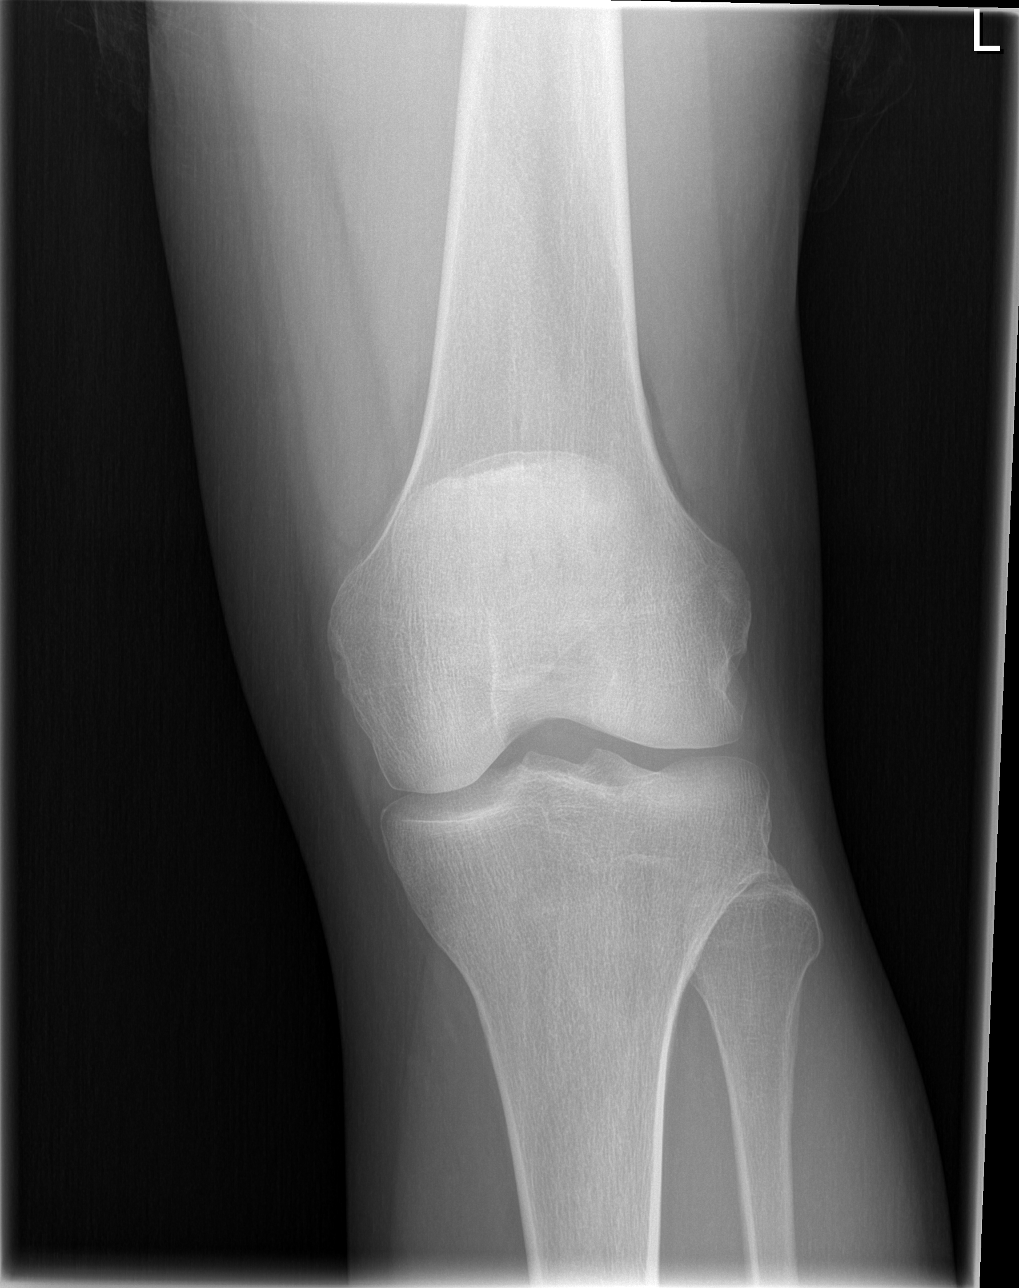

[t knee oblique left (1 of 2)]
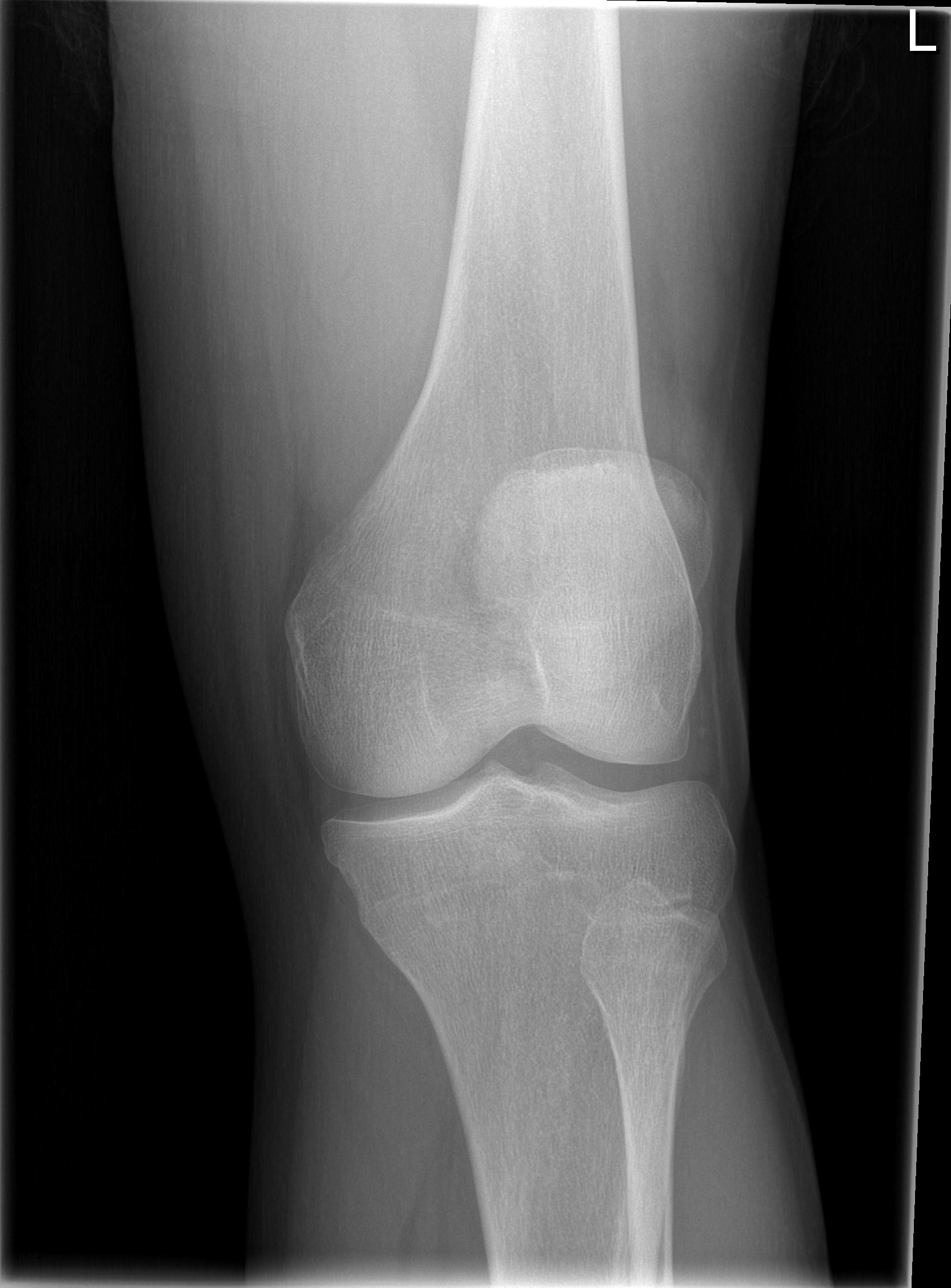

[t knee oblique left (2 of 2)]
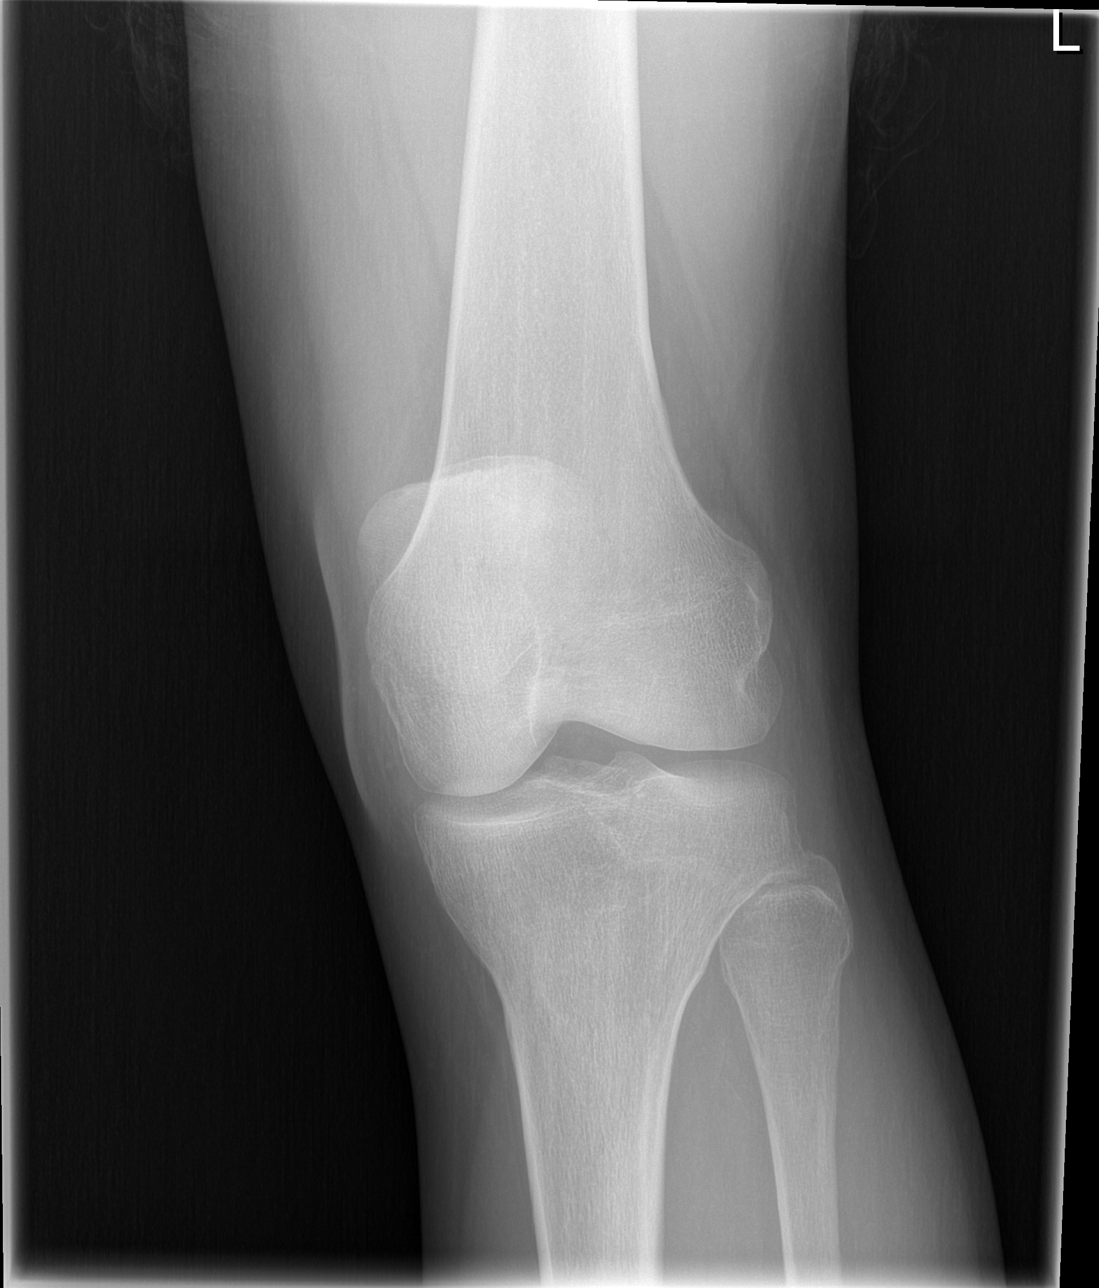

[t knee lat left]
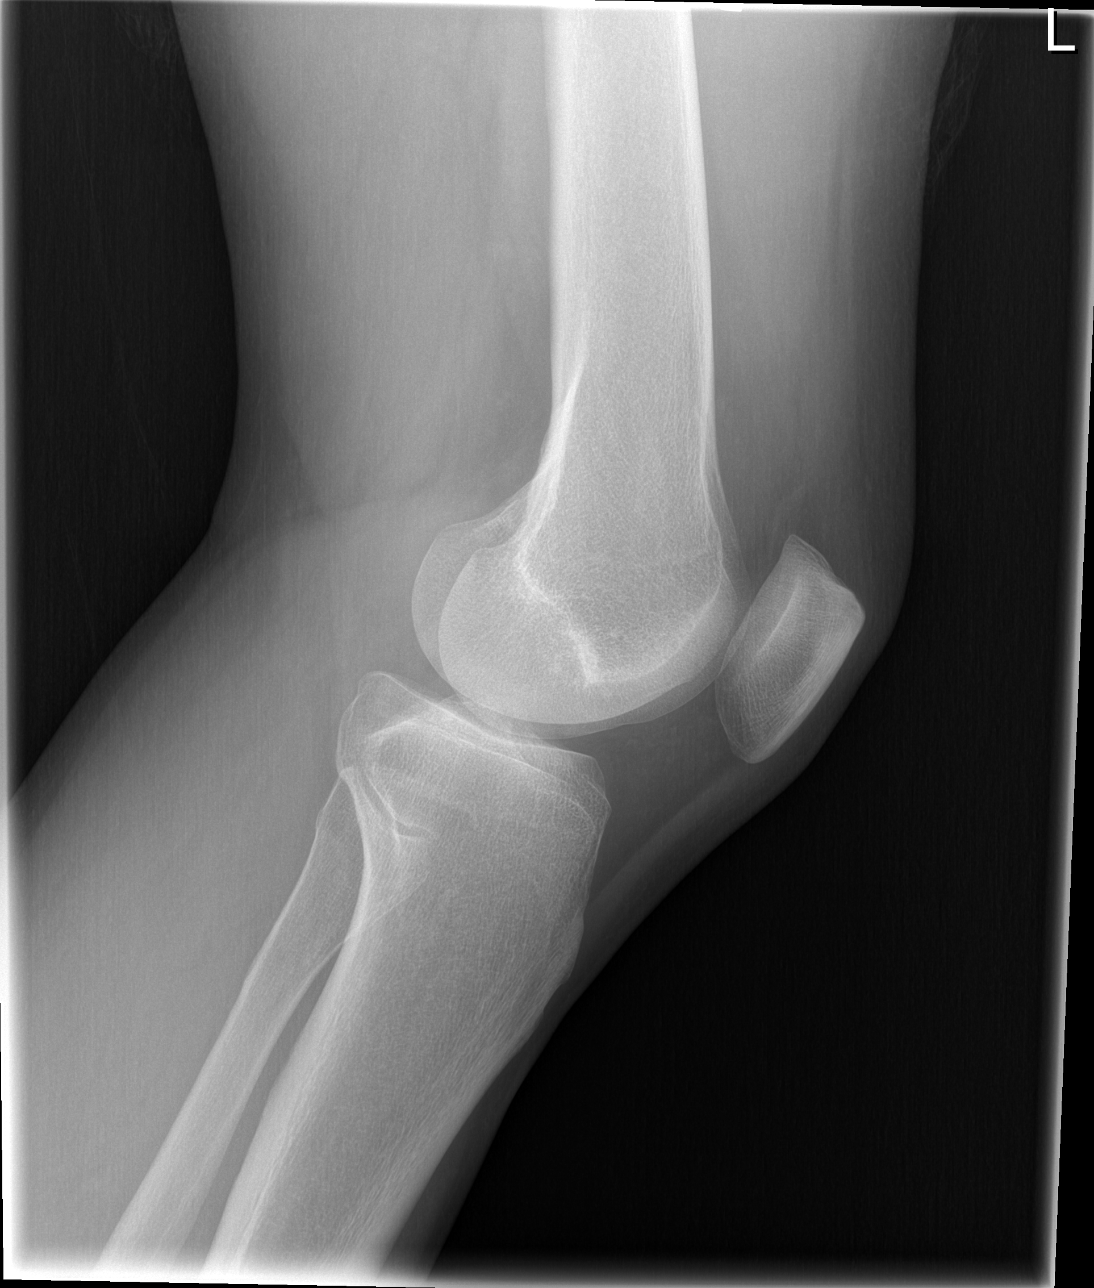

[4 of 4 positions shown; findings below may reference images not displayed]

FINDINGS: Frontal, lateral, and bilateral oblique views were obtained. There
is no fracture or dislocation. No joint effusion. The joint spaces
appear normal. No erosive change.
IMPRESSION: No fracture or dislocation.  No evident arthropathy.

## 2019-10-17 ENCOUNTER — Telehealth: Payer: Self-pay | Admitting: Family Medicine

## 2019-10-17 NOTE — Telephone Encounter (Signed)
Sent patient mychart message

## 2019-10-17 NOTE — Telephone Encounter (Signed)
Patient wife is calling to ask he can receive information regarding the COVID vaccine. 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 Please advise [phone]

## 2020-07-03 ENCOUNTER — Encounter: Payer: Self-pay | Admitting: Family Medicine

## 2020-07-03 ENCOUNTER — Other Ambulatory Visit: Payer: Self-pay

## 2020-07-03 ENCOUNTER — Ambulatory Visit (INDEPENDENT_AMBULATORY_CARE_PROVIDER_SITE_OTHER): Payer: BLUE CROSS/BLUE SHIELD | Admitting: Family Medicine

## 2020-07-03 VITALS — BP 122/78 | HR 102 | Temp 98.1°F | Ht 72.0 in | Wt 182.2 lb

## 2020-07-03 DIAGNOSIS — R109 Unspecified abdominal pain: Secondary | ICD-10-CM | POA: Diagnosis not present

## 2020-07-03 DIAGNOSIS — R140 Abdominal distension (gaseous): Secondary | ICD-10-CM

## 2020-07-03 DIAGNOSIS — G8929 Other chronic pain: Secondary | ICD-10-CM

## 2020-07-03 DIAGNOSIS — R195 Other fecal abnormalities: Secondary | ICD-10-CM

## 2020-07-03 MED ORDER — NORTRIPTYLINE HCL 10 MG PO CAPS: 10.0000 mg | ORAL_CAPSULE | Freq: Every day | ORAL | 2 refills | Status: DC

## 2020-07-03 NOTE — Progress Notes (Signed)
Chief Complaint  Patient presents with  . Diarrhea    4 to 6 times per day.  on going for years  . Bloated    fluctuating weight  . Gas  . Insomnia  . Leg Pain    right    Shawn Robbins is here for abdominal pain.  Duration: 18 months Nighttime awakenings? Yes Bleeding? No Weight loss? No Palliation: does notice things get better at night Provocation: certain foods; no correlation with gluten or dairy Associated symptoms: nausea, bloating/gas, urgency and freq of stools; loose stools, R calf pain for the past day Denies: fever and vomiting; also denies history of clot, recent travel, recent bedrest, recent surgery Treatment to date: Advil  Past Medical History:  Diagnosis Date  . Allergy   . Asthma    Athletic induced  . GERD (gastroesophageal reflux disease)     BP 122/78 (BP Location: Right Arm, Patient Position: Sitting, Cuff Size: Normal)   Pulse (!) 102   Temp 98.1 F (36.7 C) (Oral)   Ht 6' (1.829 m)   Wt 182 lb 4 oz (82.7 kg)   SpO2 97%   BMI 24.72 kg/m  Gen.: Awake, alert, appears stated age 44: Mucous membranes moist without mucosal lesions Heart: Regular rate and rhythm without murmurs Lungs: Clear auscultation bilaterally, no rales or wheezing, normal effort without accessory muscle use. Abdomen: Bowel sounds are present. Abdomen is soft, tender over the right side of the abdomen, mildly distended, no masses or organomegaly. Negative Murphy's, Rovsing's, McBurney's, and Carnett's sign. MSK: Tender to palpation of the right lateral calf; there is no tenderness over the medial portion of the calf where the deep veins are located. Psych: Age appropriate judgment and insight. Normal mood and affect.  Bloating - Plan: Ambulatory referral to Gastroenterology  Chronic abdominal pain - Plan: nortriptyline (PAMELOR) 10 MG capsule, Ambulatory referral to Gastroenterology  Loose stools  Given his nighttime awakenings, I would like for him to see the  gastroenterology team. We will empirically start nortriptyline. I will hold off any lab work as the GI team may perform a scope. Metamucil recommended. A food journal/diary is also recommended. Could consider a low FODMAP approach. I do not think his calf pain has to do with anything sinister. He has no risk factors for clot in the location of his pain is not were any deep vessels located. I gave him some stretches and exercises and he should follow-up with his regular PCP if no improvement. F/u as originally scheduled with his regular PCP Pt voiced understanding and agreement to the plan.  [place], DO 07/03/20 4:39 PM

## 2020-07-03 NOTE — Patient Instructions (Signed)
If you do not hear anything about your referral in the next 1-2 weeks, call our office and ask for an update.  Take some Metamucil to help with stools.  Let me know if there are cost issues.  Keep a food/symptom journal.   Let us know if you need anything.  Stretching and range of motion exercises These exercises warm up your muscles and joints and improve the movement and flexibility of your lower leg. These exercises also help to relieve pain and stiffness.  Exercise A: Gastrocnemius stretch 1. Sit with your left / right leg extended. 2. Loop a belt or towel around the ball of your left / right foot. The ball of your foot is on the walking surface, right under your toes. 3. Hold both ends of the belt or towel. 4. Keep your left / right ankle and foot relaxed and keep your knee straight while you use the belt or towel to pull your foot and ankle toward you. Stop at the first point of resistance. 5. Hold this position for 30 seconds. Repeat 2 times. Complete this exercise 3 times per week.  Exercise B: Ankle alphabet 1. Sit with your left / right leg supported at the lower leg. ? Do not rest your foot on anything. ? Make sure your foot has room to move freely. 2. Think of your left / right foot as a paintbrush, and move your foot to trace each letter of the alphabet in the air. Keep your hip and knee still while you trace. 3. Trace every letter from A to Z. Repeat 2 times. Complete this exercise 3 times per week.  Strengthening exercises These exercises build strength and endurance in your lower leg. Endurance is the ability to use your muscles for a long time, even after they get tired.  Exercise C: Plantar flexors with band 1. Sit with your left / right leg extended. 2. Loop a rubber exercise band or tube around the ball of your left / right foot. The ball of your foot is on the walking surface, right under your toes. 3. While holding both ends of the band or tube, slowly point  your toes downward, pushing them away from you. 4. Hold this position for 3 seconds. 5. Slowly return your foot to the starting position and repeat for a total of 10 repetitions. Repeat 2 times. Complete this exercise 3 times per week.  Exercise D: Plantar flexors, standing 1. Stand with your feet shoulder-width apart. 2. Place your hands on a wall or table to steady yourself as needed, but try not to use it very much for support. 3. Rise up on your toes. 4. If this exercise is too easy, try these options: ? Shift your weight toward your left / right leg until you feel challenged. ? If told by your health care provider, stand on your left / right foot only. 5. Hold this position for 3 seconds. 6. Repeat for a total of 10 repetitions. Repeat 2 times. Complete this exercise 3 times per week.  Exercise E: Plantar flexors, eccentric 1. Stand on the balls of your feet on the edge of a step. The ball of your foot is on the walking surface, right under your toes. 2. Place your hands on a wall or railing for balance as needed, but try not to lean on it for support. 3. Rise up on your toes, using both legs to help. 4. Slowly shift all of your weight to your left / right  foot and lift your other foot off the step. 5. Slowly lower your left / right heel so it drops below the level of the step. You will feel a slight stretch in your left / right calf. 6. Put your other foot back onto the step. Repeat 2 times. Complete this exercise 3 times per week. This information is not intended to replace advice given to you by your health care provider. Make sure you discuss any questions you have with your health care provider.

## 2020-08-21 ENCOUNTER — Ambulatory Visit: Payer: BLUE CROSS/BLUE SHIELD | Admitting: Gastroenterology

## 2020-09-10 ENCOUNTER — Encounter: Payer: Self-pay | Admitting: Gastroenterology

## 2020-09-10 ENCOUNTER — Ambulatory Visit (INDEPENDENT_AMBULATORY_CARE_PROVIDER_SITE_OTHER): Payer: BLUE CROSS/BLUE SHIELD | Admitting: Gastroenterology

## 2020-09-10 ENCOUNTER — Other Ambulatory Visit (INDEPENDENT_AMBULATORY_CARE_PROVIDER_SITE_OTHER): Payer: BLUE CROSS/BLUE SHIELD

## 2020-09-10 VITALS — BP 118/80 | HR 78 | Ht 72.0 in | Wt 183.0 lb

## 2020-09-10 DIAGNOSIS — R140 Abdominal distension (gaseous): Secondary | ICD-10-CM | POA: Diagnosis not present

## 2020-09-10 DIAGNOSIS — K219 Gastro-esophageal reflux disease without esophagitis: Secondary | ICD-10-CM

## 2020-09-10 DIAGNOSIS — R1011 Right upper quadrant pain: Secondary | ICD-10-CM

## 2020-09-10 DIAGNOSIS — R194 Change in bowel habit: Secondary | ICD-10-CM

## 2020-09-10 DIAGNOSIS — R109 Unspecified abdominal pain: Secondary | ICD-10-CM

## 2020-09-10 DIAGNOSIS — R197 Diarrhea, unspecified: Secondary | ICD-10-CM

## 2020-09-10 LAB — CBC WITH DIFFERENTIAL/PLATELET
Basophils Absolute: 0 10*3/uL (ref 0.0–0.1)
Basophils Relative: 0.3 % (ref 0.0–3.0)
Eosinophils Absolute: 0.1 10*3/uL (ref 0.0–0.7)
Eosinophils Relative: 0.8 % (ref 0.0–5.0)
HCT: 47.4 % (ref 39.0–52.0)
Hemoglobin: 16.1 g/dL (ref 13.0–17.0)
Lymphocytes Relative: 15.9 % (ref 12.0–46.0)
Lymphs Abs: 1.8 10*3/uL (ref 0.7–4.0)
MCHC: 34 g/dL (ref 30.0–36.0)
MCV: 92.9 fl (ref 78.0–100.0)
Monocytes Absolute: 0.6 10*3/uL (ref 0.1–1.0)
Monocytes Relative: 5.5 % (ref 3.0–12.0)
Neutro Abs: 8.8 10*3/uL — ABNORMAL HIGH (ref 1.4–7.7)
Neutrophils Relative %: 77.5 % — ABNORMAL HIGH (ref 43.0–77.0)
Platelets: 270 10*3/uL (ref 150.0–400.0)
RBC: 5.11 Mil/uL (ref 4.22–5.81)
RDW: 12.6 % (ref 11.5–15.5)
WBC: 11.4 10*3/uL — ABNORMAL HIGH (ref 4.0–10.5)

## 2020-09-10 LAB — BASIC METABOLIC PANEL
BUN: 12 mg/dL (ref 6–23)
CO2: 28 mEq/L (ref 19–32)
Calcium: 9.4 mg/dL (ref 8.4–10.5)
Chloride: 103 mEq/L (ref 96–112)
Creatinine, Ser: 1.02 mg/dL (ref 0.40–1.50)
GFR: 89.17 mL/min (ref >60.00)
Glucose, Bld: 89 mg/dL (ref 70–99)
Potassium: 3.6 mEq/L (ref 3.5–5.1)
Sodium: 139 mEq/L (ref 135–145)

## 2020-09-10 LAB — SEDIMENTATION RATE: Sed Rate: 5 mm/hr (ref 0–15)

## 2020-09-10 LAB — C-REACTIVE PROTEIN: CRP: 1.2 mg/dL (ref 0.5–20.0)

## 2020-09-10 MED ORDER — CLENPIQ 10-3.5-12 MG-GM -GM/160ML PO SOLN: 1.0000 | ORAL | 0 refills | Status: DC

## 2020-09-10 NOTE — Progress Notes (Signed)
Chief Complaint: Abdominal pain, change in bowel habits, GERD, bloating  Referring Provider:    Riki Sheer, DO   HPI:     Shawn Robbins is a 44 y.o. male referred to the [facility]astroenterology Clinic for evaluation of multiple GI symptoms, to include abdominal pain, change in bowel habits, bloating, reflux symptoms, fluctuating weight.  Has a longstanding history of GERD sxs described as nocturnal HB, regurgitation, choking, coughing with subsequent pleuritic-type pain. Has been present for years, previously controlled, but increasing symptoms lately.   Sleeps with HOB elevated. Takes Pepcid AC qhs (and prn breaktrough- rare) and Prevacid daily. Rare daytime sxs with Rx. Worse with spicy, tomato-based foods. +food avoidances. No dysphagia.   Other GI sxs are newer, but more bothersome. +post prandial fecal urgency with 6 BM/day. Discomfort in RUQ/right sided of abdomen. Symptoms can last throughout the day, worse in AM. Wakes up at 0500 to have BM with 3-4 BM over first 1.5 hours or so, then again after work. Occasional nocturnal stool. No hematochezia, melena. Has variable stool consistency, but more so soft/loose, but not watery. +intermittent bloating. Has trialed diet diary. Improvement with a few BMs. No preceding Abx, meds, hospitalizations, expesiures, etc. Travels domestically for work. Trialed a little of metamucil, but stopped due to concerns about worsening diarrheal symptoms.  Does note worsening symptoms with travel, even gives him great concern to be seated in certain areas of the airplane.  Weight fluctuates between 175-185. No fever, night sweats.  No recent labs or abdominal imaging for review.  No previous EGD or colonoscopy.   FHx n/f mother with IBS. No Crohns or UC. No CRC, GI malignancy.    Past Medical History:  Diagnosis Date  . Allergy   . Asthma    Athletic induced  . GERD (gastroesophageal reflux disease)      Past Surgical History:    Procedure Laterality Date  . EYE SURGERY     Left eye as a child  . HERNIA REPAIR     Family History  Problem Relation Age of Onset  . Heart disease Mother   . Muscular dystrophy Mother   . Asthma Mother   . Diabetes Mother   . Cancer Father   . Parkinson's disease Father   . Lymphoma Father    Social History   Tobacco Use  . Smoking status: Former Research scientist (life sciences)  . Smokeless tobacco: Never Used  Vaping Use  . Vaping Use: Former  Substance Use Topics  . Alcohol use: Yes    Alcohol/week: 0.0 standard drinks  . Drug use: Not Currently   Current Outpatient Medications  Medication Sig Dispense Refill  . cetirizine (ZYRTEC) 10 MG tablet Take 10 mg by mouth daily.    . fluticasone (FLONASE) 50 MCG/ACT nasal spray Place 2 sprays into both nostrils daily as needed.     . lansoprazole (PREVACID) 30 MG capsule Take 30 mg by mouth daily.     No current facility-administered medications for this visit.   No Known Allergies   Review of Systems: All systems reviewed and negative except where noted in HPI.     Physical Exam:    Wt Readings from Last 3 Encounters:  09/10/20 183 lb (83 kg)  07/03/20 182 lb 4 oz (82.7 kg)  10/30/16 198 lb 6.4 oz (90 kg)    BP 118/80   Pulse 78   Ht 6' (1.829 m)   Wt 183 lb (83  kg)   BMI 24.82 kg/m  Constitutional:  Pleasant, in no acute distress. Psychiatric: Normal mood and affect. Behavior is normal. EENT: Pupils normal.  Conjunctivae are normal. No scleral icterus. Neck supple. No cervical LAD. Cardiovascular: Normal rate, regular rhythm. No edema Pulmonary/chest: Effort normal and breath sounds normal. No wheezing, rales or rhonchi. Abdominal: Soft, nondistended, nontender. Bowel sounds active throughout. There are no masses palpable. No hepatomegaly. Neurological: Alert and oriented to person place and time. Skin: Skin is warm and dry. No rashes noted.   ASSESSMENT AND PLAN;   1) Change in bowel habits 2) Diarrhea 3) RUQ pain 4)  Abdominal bloating  -Colonoscopy with random and directed biopsies to evaluate for IBD, MC, etc. -EGD with gastric/duodenal biopsies -Check ESR, CRP, GI PCR panel -Check CBC, BMP -Trial fiber supplement such as Citrucel or Benefiber -Can likely benefit from low FODMAP diet -Was previously prescribed trial of Elavil by PCM-did not take -Discussed strong overlap between stress (particularly travel related stress) and GI symptomatology. Increasing suspicion for IBS -All above unrevealing and symptoms persist, can work-up for biliary etiology  5) GERD  -EGD to evaluate for erosive esophagitis, LES laxity, hiatal hernia -Continue aspiration medications as currently doing -Continue antireflux lifestyle/dietary modifications -Modification in treatment plan pending endoscopic findings  The indications, risks, and benefits of EGD and colonoscopy were explained to the patient in detail. Risks include but are not limited to bleeding, perforation, adverse reaction to medications, and cardiopulmonary compromise. Sequelae include but are not limited to the possibility of surgery, hositalization, and mortality. The patient verbalized understanding and wished to proceed. All questions answered, referred to scheduler and bowel prep ordered. Further recommendations pending results of the exam.      Lavena Bullion, DO, FACG  09/10/2020, 8:29 AM   [place], Gay Filler, MD Riki Sheer, DO

## 2020-09-10 NOTE — Patient Instructions (Signed)
If you are age 44 or older, your body mass index should be between 23-30. Your Body mass index is 24.82 kg/m. If this is out of the aforementioned range listed, please consider follow up with your Primary Care Provider.  If you are age 35 or younger, your body mass index should be between 19-25. Your Body mass index is 24.82 kg/m. If this is out of the aformentioned range listed, please consider follow up with your Primary Care Provider.   We have sent the following medications to your pharmacy for you to pick up at your convenience:  [place] provider has requested that you have lab work today. We ask that you go to our [facility] office at [address], [place], [place] [address]. Please enter through the main entrance and go to the elevator.  Press "B" on the elevator. The lab is located at the first door on the left as you exit the elevator.  Due to recent changes in healthcare laws, you may see the results of your imaging and laboratory studies on MyChart before your provider has had a chance to review them.  We understand that in some cases there may be results that are confusing or concerning to you. Not all laboratory results come back in the same time frame and the provider may be waiting for multiple results in order to interpret others.  Please give [place] 48 hours in order for your provider to thoroughly review all the results before contacting the office for clarification of your results.

## 2020-09-12 ENCOUNTER — Telehealth: Payer: Self-pay | Admitting: General Surgery

## 2020-09-12 DIAGNOSIS — D72829 Elevated white blood cell count, unspecified: Secondary | ICD-10-CM

## 2020-09-12 NOTE — Telephone Encounter (Signed)
-----  Message from [place], DO sent at 09/11/2020  4:37 PM EST ----- Elevated WBC 11.4, otherwise normal CBC.  Normal ESR, CRP, BMP.  Will follow up on stool studies that were previously ordered.  Otherwise, for the mildly elevated WBC, plan on repeat CBC in 2 weeks, and if still elevated, will discuss with Dr. Lorelei Pont and possible Hematology referral as appropriate.

## 2020-09-12 NOTE — Telephone Encounter (Signed)
Contacted the patient and went over lab results. Patient verbalized understanding of his elevated WBC. He is currently in [place] and asked if I would send a [facility] requisition to his home for his bloodwork. The patient will have his cbc drawn in 2 weeks.

## 2020-09-17 ENCOUNTER — Other Ambulatory Visit: Payer: BLUE CROSS/BLUE SHIELD

## 2020-09-17 DIAGNOSIS — K219 Gastro-esophageal reflux disease without esophagitis: Secondary | ICD-10-CM

## 2020-09-17 DIAGNOSIS — R1011 Right upper quadrant pain: Secondary | ICD-10-CM

## 2020-09-17 DIAGNOSIS — R140 Abdominal distension (gaseous): Secondary | ICD-10-CM

## 2020-09-17 DIAGNOSIS — R197 Diarrhea, unspecified: Secondary | ICD-10-CM

## 2020-09-20 LAB — GI PROFILE, STOOL, PCR

## 2020-09-24 ENCOUNTER — Other Ambulatory Visit (INDEPENDENT_AMBULATORY_CARE_PROVIDER_SITE_OTHER): Payer: BLUE CROSS/BLUE SHIELD

## 2020-09-24 DIAGNOSIS — D72829 Elevated white blood cell count, unspecified: Secondary | ICD-10-CM

## 2020-09-24 LAB — CBC WITH DIFFERENTIAL/PLATELET
Basophils Absolute: 0 10*3/uL (ref 0.0–0.1)
Basophils Relative: 0.5 % (ref 0.0–3.0)
Eosinophils Absolute: 0.1 10*3/uL (ref 0.0–0.7)
Eosinophils Relative: 0.9 % (ref 0.0–5.0)
HCT: 44.1 % (ref 39.0–52.0)
Hemoglobin: 15.3 g/dL (ref 13.0–17.0)
Lymphocytes Relative: 34.8 % (ref 12.0–46.0)
Lymphs Abs: 3 10*3/uL (ref 0.7–4.0)
MCHC: 34.8 g/dL (ref 30.0–36.0)
MCV: 92.6 fl (ref 78.0–100.0)
Monocytes Absolute: 0.5 10*3/uL (ref 0.1–1.0)
Monocytes Relative: 5.4 % (ref 3.0–12.0)
Neutro Abs: 5 10*3/uL (ref 1.4–7.7)
Neutrophils Relative %: 58.4 % (ref 43.0–77.0)
Platelets: 304 10*3/uL (ref 150.0–400.0)
RBC: 4.76 Mil/uL (ref 4.22–5.81)
RDW: 12.5 % (ref 11.5–15.5)
WBC: 8.5 10*3/uL (ref 4.0–10.5)

## 2020-09-24 NOTE — Addendum Note (Signed)
Addended by: Lerry Liner on: 09/24/2020 03:38 PM   Modules accepted: Orders

## 2020-10-12 ENCOUNTER — Encounter: Payer: Self-pay | Admitting: Gastroenterology

## 2020-10-17 ENCOUNTER — Other Ambulatory Visit: Payer: Self-pay | Admitting: Gastroenterology

## 2020-10-17 ENCOUNTER — Telehealth: Payer: Self-pay

## 2020-10-17 ENCOUNTER — Ambulatory Visit (AMBULATORY_SURGERY_CENTER): Payer: BLUE CROSS/BLUE SHIELD | Admitting: Gastroenterology

## 2020-10-17 ENCOUNTER — Encounter: Payer: Self-pay | Admitting: Gastroenterology

## 2020-10-17 ENCOUNTER — Other Ambulatory Visit: Payer: Self-pay

## 2020-10-17 VITALS — BP 108/81 | HR 81 | Temp 98.1°F | Resp 13 | Ht 72.0 in | Wt 183.0 lb

## 2020-10-17 DIAGNOSIS — R197 Diarrhea, unspecified: Secondary | ICD-10-CM

## 2020-10-17 DIAGNOSIS — R194 Change in bowel habit: Secondary | ICD-10-CM

## 2020-10-17 DIAGNOSIS — K2100 Gastro-esophageal reflux disease with esophagitis, without bleeding: Secondary | ICD-10-CM

## 2020-10-17 DIAGNOSIS — K317 Polyp of stomach and duodenum: Secondary | ICD-10-CM

## 2020-10-17 DIAGNOSIS — K3189 Other diseases of stomach and duodenum: Secondary | ICD-10-CM | POA: Diagnosis not present

## 2020-10-17 DIAGNOSIS — D125 Benign neoplasm of sigmoid colon: Secondary | ICD-10-CM | POA: Diagnosis not present

## 2020-10-17 DIAGNOSIS — R1011 Right upper quadrant pain: Secondary | ICD-10-CM

## 2020-10-17 DIAGNOSIS — K259 Gastric ulcer, unspecified as acute or chronic, without hemorrhage or perforation: Secondary | ICD-10-CM

## 2020-10-17 DIAGNOSIS — K319 Disease of stomach and duodenum, unspecified: Secondary | ICD-10-CM | POA: Diagnosis not present

## 2020-10-17 DIAGNOSIS — K449 Diaphragmatic hernia without obstruction or gangrene: Secondary | ICD-10-CM

## 2020-10-17 DIAGNOSIS — R140 Abdominal distension (gaseous): Secondary | ICD-10-CM

## 2020-10-17 DIAGNOSIS — K5730 Diverticulosis of large intestine without perforation or abscess without bleeding: Secondary | ICD-10-CM | POA: Diagnosis not present

## 2020-10-17 MED ORDER — SODIUM CHLORIDE 0.9 % IV SOLN: 500.0000 mL | Freq: Once | INTRAVENOUS | Status: DC

## 2020-10-17 MED ORDER — PANTOPRAZOLE SODIUM 40 MG PO TBEC: 40.0000 mg | DELAYED_RELEASE_TABLET | Freq: Two times a day (BID) | ORAL | 0 refills | Status: DC

## 2020-10-17 NOTE — Op Note (Signed)
[facility] Patient Name: Shawn Robbins Procedure Date: 10/17/2020 1:47 PM MRN: [id number] Endoscopist: Gerrit Heck , MD Age: 44 Referring MD:  Date of Birth: 1976/10/08 Gender: Male Account #: [id number] Procedure:                Colonoscopy Indications:              Abdominal pain in the right upper quadrant, Change                            in bowel habits, Diarrhea Medicines:                Monitored Anesthesia Care Procedure:                Pre-Anesthesia Assessment:                           - Prior to the procedure, a History and Physical                            was performed, and patient medications and                            allergies were reviewed. The patient's tolerance of                            previous anesthesia was also reviewed. The risks                            and benefits of the procedure and the sedation                            options and risks were discussed with the patient.                            All questions were answered, and informed consent                            was obtained. Prior Anticoagulants: The patient has                            taken no previous anticoagulant or antiplatelet                            agents. ASA Grade Assessment: II - A patient with                            mild systemic disease. After reviewing the risks                            and benefits, the patient was deemed in                            satisfactory condition to undergo the procedure.  After obtaining informed consent, the colonoscope                            was passed under direct vision. Throughout the                            procedure, the patient's blood pressure, pulse, and                            oxygen saturations were monitored continuously. The                            Olympus CF-HQ190 [id number]) Colonoscope was                            introduced through the anus and  advanced to the the                            terminal ileum. The colonoscopy was performed                            without difficulty. The patient tolerated the                            procedure well. The quality of the bowel                            preparation was fair. The terminal ileum, ileocecal                            valve, appendiceal orifice, and rectum were                            photographed. Scope In: 2:10:49 PM Scope Out: 2:24:28 PM Scope Withdrawal Time: 0 hours 11 minutes 49 seconds  Total Procedure Duration: 0 hours 13 minutes 39 seconds  Findings:                 The perianal and digital rectal examinations were                            normal.                           A 5 mm polyp was found in the sigmoid colon. The                            polyp was sessile. The polyp was removed with a                            cold snare. Resection and retrieval were complete.                            Estimated blood loss was minimal.  Multiple small and large-mouthed diverticula were                            found in the sigmoid colon.                           A moderate amount of semi-liquid stool was found                            scattered throughout the colon, interfering with                            visualization. Lavage of the area was performed                            using copious amounts of tap water, resulting in                            clearance with fair visualization.                           The visualized mucosa was otherwise normal                            appeaering throughout the remainder of the colon.                            Biopsies for histology were taken with a cold                            forceps from the right colon and left colon for                            evaluation of microscopic colitis. Estimated blood                            loss was minimal.                            The retroflexed view of the distal rectum and anal                            verge was normal and showed no anal or rectal                            abnormalities.                           The terminal ileum appeared normal. Complications:            No immediate complications. Estimated Blood Loss:     Estimated blood loss was minimal. Impression:               - Preparation of the colon was fair. Stool  scattered throughout the entire examined colon.                            Unable to rule out the presence of small or flat                            polyps in these areas, and therefore while this                            exam is considered adequate for the goals of this                            study, this is not considered adequate from a colon                            cancer screening standpoint, and therefore                            recommend shorter interval repeat colonoscopy as                            below for screening/surveillance purposes.                           - One 5 mm polyp in the sigmoid colon, removed with                            a cold snare. Resected and retrieved.                           - Diverticulosis in the sigmoid colon.                           - The visualized mucosa was otherwise normal                            appeaering throughout the remainder of the colon.                            Biopsied.                           - The distal rectum and anal verge are normal on                            retroflexion view.                           - The examined portion of the ileum was normal. Recommendation:           - Patient has a contact number available for                            emergencies. The signs and symptoms of potential  delayed complications were discussed with the                            patient. Return to normal activities tomorrow.                             Written discharge instructions were provided to the                            patient.                           - Resume previous diet.                           - Continue present medications.                           - Await pathology results.                           - Repeat colonoscopy in 1 year for routine colon                            cancer screening/polyp surveillance because the                            bowel preparation was suboptimal. Recommend                            extended 2-day prep for repeat study.                           - Use fiber, for example Citrucel, Fibercon, Konsyl                            or Metamucil. Gerrit Heck, MD 10/17/2020 2:42:07 PM

## 2020-10-17 NOTE — Patient Instructions (Signed)
Handout on polyps provided. Await pathology results.   YOU HAD AN ENDOSCOPIC PROCEDURE TODAY AT [facility]:   Refer to the procedure report that was given to you for any specific questions about what was found during the examination.  If the procedure report does not answer your questions, please call your gastroenterologist to clarify.  If you requested that your care partner not be given the details of your procedure findings, then the procedure report has been included in a sealed envelope for you to review at your convenience later.  YOU SHOULD EXPECT: Some feelings of bloating in the abdomen. Passage of more gas than usual.  Walking can help get rid of the air that was put into your GI tract during the procedure and reduce the bloating. If you had a lower endoscopy (such as a colonoscopy or flexible sigmoidoscopy) you may notice spotting of blood in your stool or on the toilet paper. If you underwent a bowel prep for your procedure, you may not have a normal bowel movement for a few days.  Please Note:  You might notice some irritation and congestion in your nose or some drainage.  This is from the oxygen used during your procedure.  There is no need for concern and it should clear up in a day or so.  SYMPTOMS TO REPORT IMMEDIATELY:   Following lower endoscopy (colonoscopy or flexible sigmoidoscopy):  Excessive amounts of blood in the stool  Significant tenderness or worsening of abdominal pains  Swelling of the abdomen that is new, acute  Fever of 100F or higher   Following upper endoscopy (EGD)  Vomiting of blood or coffee ground material  New chest pain or pain under the shoulder blades  Painful or persistently difficult swallowing  New shortness of breath  Fever of 100F or higher  Black, tarry-looking stools  For urgent or emergent issues, a gastroenterologist can be reached at any hour by calling [phone]. Do not use MyChart messaging for urgent  concerns.    DIET:  We do recommend a small meal at first, but then you may proceed to your regular diet.  Drink plenty of fluids but you should avoid alcoholic beverages for 24 hours.  MEDICATIONS: Stop Prevacid and change to Protonix 40 mg twice per day for 8 weeks.   ACTIVITY:  You should plan to take it easy for the rest of today and you should NOT DRIVE or use heavy machinery until tomorrow (because of the sedation medicines used during the test).    FOLLOW UP: Our staff will call the number listed on your records 48-72 hours following your procedure to check on you and address any questions or concerns that you may have regarding the information given to you following your procedure. If we do not reach you, we will leave a message.  We will attempt to reach you two times.  During this call, we will ask if you have developed any symptoms of COVID 19. If you develop any symptoms (ie: fever, flu-like symptoms, shortness of breath, cough etc.) before then, please call [phone].  If you test positive for Covid 19 in the 2 weeks post procedure, please call and report this information to [place].    If any biopsies were taken you will be contacted by phone or by letter within the next 1-3 weeks.  Please call us at [phone] if you have not heard about the biopsies in 3 weeks.    SIGNATURES/CONFIDENTIALITY: You and/or your care  partner have signed paperwork which will be entered into your electronic medical record.  These signatures attest to the fact that that the information above on your After Visit Summary has been reviewed and is understood.  Full responsibility of the confidentiality of this discharge information lies with you and/or your care-partner.

## 2020-10-17 NOTE — Progress Notes (Signed)
Medical history reviewed with no changes noted. VS assessed by C.W 

## 2020-10-17 NOTE — Op Note (Signed)
[facility] Patient Name: Shawn Robbins Procedure Date: 10/17/2020 1:48 PM MRN: [id number] Endoscopist: Gerrit Heck , MD Age: 44 Referring MD:  Date of Birth: [birth year] Gender: Male Account #: [id number] Procedure:                Upper GI endoscopy Indications:              Abdominal pain in the right upper quadrant,                            Heartburn, Suspected esophageal reflux, Abdominal                            bloating, Diarrhea Medicines:                Monitored Anesthesia Care Procedure:                Pre-Anesthesia Assessment:                           - Prior to the procedure, a History and Physical                            was performed, and patient medications and                            allergies were reviewed. The patient's tolerance of                            previous anesthesia was also reviewed. The risks                            and benefits of the procedure and the sedation                            options and risks were discussed with the patient.                            All questions were answered, and informed consent                            was obtained. Prior Anticoagulants: The patient has                            taken no previous anticoagulant or antiplatelet                            agents. ASA Grade Assessment: II - A patient with                            mild systemic disease. After reviewing the risks                            and benefits, the patient was deemed in  satisfactory condition to undergo the procedure.                           After obtaining informed consent, the endoscope was                            passed under direct vision. Throughout the                            procedure, the patient's blood pressure, pulse, and                            oxygen saturations were monitored continuously. The                            Endoscope was introduced through the  mouth, and                            advanced to the second part of duodenum. The upper                            GI endoscopy was accomplished without difficulty.                            The patient tolerated the procedure well. Scope In: Scope Out: Findings:                 LA Grade B (one or more mucosal breaks greater than                            5 mm, not extending between the tops of two mucosal                            folds) esophagitis with no bleeding was found in                            the lower third of the esophagus.                           A 4 cm hiatal hernia was present. LES laxity with a                            Hill Grade IV valve noted on retroflexion.                           A few small sessile polyps with no bleeding and no                            stigmata of recent bleeding were found in the                            gastric fundus and in the gastric body. A few of  these polyps were removed with a cold biopsy                            forceps for histologic representative evaluation.                            Resection and retrieval were complete. Estimated                            blood loss was minimal.                           A single erosion with surrounding mucosal erythema                            and localized edema was found in the antrum. No                            stigmata of recent bleeding. Biopsies were taken                            with a cold forceps for histology. Estimated blood                            loss was minimal.                           The mucosa was otherwise normal appearing in the                            gastric fundus, in the gastric body and at the                            incisura. Biopsies were taken with a cold forceps                            for Helicobacter pylori testing. Estimated blood                            loss was minimal.                            The examined duodenum was normal. Biopsies for                            histology were taken with a cold forceps for                            evaluation of celiac disease. Estimated blood loss                            was minimal. Complications:            No immediate complications. Estimated Blood Loss:     Estimated blood loss was minimal. Impression:               -  LA Grade B reflux esophagitis with no bleeding.                           - 4 cm hiatal hernia.                           - A few gastric polyps. Resected and retrieved.                           - Erosive gastropathy with no stigmata of recent                            bleeding. Biopsied.                           - Normal mucosa was found in the gastric fundus, in                            the gastric body and in the incisura. Biopsied.                           - Normal examined duodenum. Biopsied. Recommendation:           - Patient has a contact number available for                            emergencies. The signs and symptoms of potential                            delayed complications were discussed with the                            patient. Return to normal activities tomorrow.                            Written discharge instructions were provided to the                            patient.                           - Resume previous diet.                           - Continue present medications.                           - Await pathology results.                           - Stop lansoprazole (Prevacid) and change to                            Protonix (pantoprazole) 40 mg PO BID for 8 weeks.                           -  Ok to resume Pepcid for breakthough reflux                            symptoms.                           - Return to GI clinic in 2-3 months or sooner as                            needed.                           - Colonoscopy today. Gerrit Heck, MD 10/17/2020 2:34:11 PM

## 2020-10-17 NOTE — Telephone Encounter (Signed)
Spoke to patient who had an EGD today. Dr Bryan Lemma discussed sending a referral to Dr Redmond Pulling at [facility] for lap hiatal hernia c/TIF. Schedule appointment for an esophageal manometry then follow up with Dr Bryan Lemma to discuss c/TIF in more detail. Patient agreed to proceed with all appointments. He asked for the next available. [facility] will contact patient with date and time,Eaophageal manometry is scheduled for 10/31/20 at 8:30 am then office visit on 11/15/20. Patient stated he will review all his appointments and instructions though My Chart and call with any questions.

## 2020-10-17 NOTE — Progress Notes (Signed)
To pacu, vss. Report to Rn.tb 

## 2020-10-17 NOTE — Telephone Encounter (Signed)
-----   Message from [place], DO sent at 10/17/2020  3:17 PM EST ----- Regarding: cTIF patient to set up Patient would like to move forward with cTIF eval. Can you please coordinate the following:  - Referral to Dr. Redmond Pulling for lap hiatal hernia eval/cTIF eval - Referral for Esophageal Manometry - Set up f/u appt with me after referral and EM complete to go over TIF again  Thanks!

## 2020-10-19 ENCOUNTER — Telehealth: Payer: Self-pay | Admitting: *Deleted

## 2020-10-19 NOTE — Telephone Encounter (Signed)
  Follow up Call-  Call back number 10/17/2020  Post procedure Call Back phone  # [phone]  Permission to leave phone message Yes  Some recent data might be hidden     Patient questions:  Do you have a fever, pain , or abdominal swelling? No. Pain Score  0 *  Have you tolerated food without any problems? Yes.    Have you been able to return to your normal activities? Yes.    Do you have any questions about your discharge instructions: Diet   No. Medications  No. Follow up visit  No.  Do you have questions or concerns about your Care? No.  Actions: * If pain score is 4 or above: No action needed, pain <4  1. Have you developed a fever since your procedure? NO  2.   Have you had an respiratory symptoms (SOB or cough) since your procedure? NO  3.   Have you tested positive for COVID 19 since your procedure NO  4.   Have you had any family members/close contacts diagnosed with the COVID 19 since your procedure?  NO   If yes to any of these questions please route to Joylene John, RN and Joella Prince, RN

## 2020-10-23 ENCOUNTER — Encounter: Payer: Self-pay | Admitting: Gastroenterology

## 2020-10-24 ENCOUNTER — Other Ambulatory Visit: Payer: Self-pay | Admitting: Gastroenterology

## 2020-10-24 MED ORDER — DICYCLOMINE HCL 10 MG PO CAPS: 10.0000 mg | ORAL_CAPSULE | Freq: Three times a day (TID) | ORAL | 0 refills | Status: DC

## 2020-10-29 ENCOUNTER — Other Ambulatory Visit (HOSPITAL_COMMUNITY)
Admission: RE | Admit: 2020-10-29 | Discharge: 2020-10-29 | Disposition: A | Discharge status: Home / Self Care | Payer: BLUE CROSS/BLUE SHIELD | Source: Ambulatory Visit | Attending: Gastroenterology | Admitting: Gastroenterology

## 2020-10-29 DIAGNOSIS — Z20822 Contact with and (suspected) exposure to covid-19: Secondary | ICD-10-CM | POA: Insufficient documentation

## 2020-10-29 DIAGNOSIS — Z01812 Encounter for preprocedural laboratory examination: Secondary | ICD-10-CM | POA: Insufficient documentation

## 2020-10-29 LAB — SARS CORONAVIRUS 2 (TAT 6-24 HRS): SARS Coronavirus 2: NEGATIVE

## 2020-10-31 ENCOUNTER — Encounter (HOSPITAL_COMMUNITY)
Admission: RE | Disposition: A | Discharge status: Home / Self Care | Payer: Self-pay | Source: Home / Self Care | Attending: Gastroenterology

## 2020-10-31 ENCOUNTER — Ambulatory Visit (HOSPITAL_COMMUNITY)
Admission: RE | Admit: 2020-10-31 | Discharge: 2020-10-31 | Disposition: A | Discharge status: Home / Self Care | Payer: BLUE CROSS/BLUE SHIELD | Attending: Gastroenterology | Admitting: Gastroenterology

## 2020-10-31 DIAGNOSIS — R059 Cough, unspecified: Secondary | ICD-10-CM | POA: Diagnosis not present

## 2020-10-31 DIAGNOSIS — R12 Heartburn: Secondary | ICD-10-CM | POA: Insufficient documentation

## 2020-10-31 DIAGNOSIS — R1110 Vomiting, unspecified: Secondary | ICD-10-CM | POA: Diagnosis not present

## 2020-10-31 DIAGNOSIS — K219 Gastro-esophageal reflux disease without esophagitis: Secondary | ICD-10-CM | POA: Diagnosis not present

## 2020-10-31 HISTORY — PX: ESOPHAGEAL MANOMETRY: SHX5429

## 2020-10-31 SURGERY — MANOMETRY, ESOPHAGUS

## 2020-10-31 MED ORDER — LIDOCAINE HCL URETHRAL/MUCOSAL 2 % EX GEL
CUTANEOUS | Status: AC
  Filled 2020-10-31: qty 30

## 2020-10-31 SURGICAL SUPPLY — 2 items
FACESHIELD LNG OPTICON STERILE (SAFETY) IMPLANT
GLOVE BIO SURGEON STRL SZ8 (GLOVE) ×4 IMPLANT

## 2020-10-31 NOTE — Progress Notes (Signed)
Esophageal manometry performed per protocol.  Patient tolerated well.  Report to be sent to Dr. Kavitha Nandigam 

## 2020-11-02 ENCOUNTER — Encounter (HOSPITAL_COMMUNITY): Payer: Self-pay | Admitting: Gastroenterology

## 2020-11-08 DIAGNOSIS — R059 Cough, unspecified: Secondary | ICD-10-CM

## 2020-11-08 DIAGNOSIS — R1110 Vomiting, unspecified: Secondary | ICD-10-CM

## 2020-11-08 DIAGNOSIS — R12 Heartburn: Secondary | ICD-10-CM

## 2020-11-08 DIAGNOSIS — K219 Gastro-esophageal reflux disease without esophagitis: Secondary | ICD-10-CM

## 2020-11-15 ENCOUNTER — Ambulatory Visit (INDEPENDENT_AMBULATORY_CARE_PROVIDER_SITE_OTHER): Payer: BLUE CROSS/BLUE SHIELD | Admitting: Gastroenterology

## 2020-11-15 ENCOUNTER — Encounter: Payer: Self-pay | Admitting: Gastroenterology

## 2020-11-15 VITALS — BP 118/80 | HR 78 | Ht 72.0 in | Wt 179.5 lb

## 2020-11-15 DIAGNOSIS — K2100 Gastro-esophageal reflux disease with esophagitis, without bleeding: Secondary | ICD-10-CM

## 2020-11-15 DIAGNOSIS — Z86010 Personal history of colonic polyps: Secondary | ICD-10-CM

## 2020-11-15 DIAGNOSIS — K580 Irritable bowel syndrome with diarrhea: Secondary | ICD-10-CM | POA: Diagnosis not present

## 2020-11-15 DIAGNOSIS — K449 Diaphragmatic hernia without obstruction or gangrene: Secondary | ICD-10-CM

## 2020-11-15 DIAGNOSIS — K224 Dyskinesia of esophagus: Secondary | ICD-10-CM

## 2020-11-15 MED ORDER — PANTOPRAZOLE SODIUM 40 MG PO TBEC: 40.0000 mg | DELAYED_RELEASE_TABLET | Freq: Two times a day (BID) | ORAL | 3 refills | Status: DC

## 2020-11-15 NOTE — Progress Notes (Signed)
P  Chief Complaint:    Procedure follow-up, GERD with erosive esophagitis  GI History: 45 year old male follows in the GI clinic for the following:  1) GERD with erosive esophagitis.  Index symptoms nocturnal HB, regurgitation, choking, coughing with subsequent pleuritic-type pain. Has been present for years.  Sleeps with HOB elevated.  Previously treated with Pepcid AC qhs (and prn breaktrough- rare) and Prevacid daily. Rare daytime sxs with Rx. Worse with spicy, tomato-based foods. +food avoidances.  EGD in 10/2020 with erosive esophagitis, changed lansoprazole to Protonix 40 mg BID with Pepcid for breakthrough. - EGD (10/2020): LA Grade B esophagitis, 4 cm HH, Hill grade 4 valve, fundic gland polyps, single gastric antral erosion otherwise normal stomach and duodenum (path negative for Celiac).   - Esophageal Manometry (10/2020): Weak peristalsis with DCI 270, failed peristalsis and 50% of swallows and ineffective in all 10/10 swallows.  Poor contractile reserve on rapid sequence swallows and poor bolus clearance (50%).  Not a candidate for cTIF  2) IBS: +post prandial fecal urgency with 6 BM/day. Discomfort in RUQ/right sided of abdomen. Symptoms can last throughout the day, worse in AM. Wakes up at 0500 to have BM with 3-4 BM over first 1.5 hours or so, then again after work. Occasional nocturnal stool. No hematochezia, melena. Has variable stool consistency, but more so soft/loose, but not watery. +intermittent bloating. Has trialed diet diary. Improvement with a few BMs. 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 - 09/2020: Normal ESR, CRP, BMP.  WBC 11.4 otherwise normal CBC (normal WBC 2 weeks later).  Normal/negative GI PCR - Colonoscopy (10/2020): 5 mm tubular adenoma, sigmoid diverticulosis, fair prep, otherwise normal mucosa (biopsies negative for [facility]).  Repeat in 1 year due to suboptimal prep - Started Citrucel with significant improvement  HPI:     Patient is a  45 y.o. male presenting to the Gastroenterology Clinic for follow-up.  Initially seen by me on 09/10/2020 for evaluation of GERD along with IBS symptoms.  Subsequently evaluated with EGD, colonoscopy, Esophageal manometry as outlined above.  Today, he states overall feels better today. Reflux sxs much improved with  Protonix bid and dietary/lifestyle changes. Only 2 breakthrough episodes since med change, and both were after small amount of bourbon. Still avoiding spicy.  GERD-HRQL Score: 19/50 (on PPI)  Lower GI sxs also much improved after starting Citrucel 1 tab/day. Now less stool frequency (3/day). Maintains good hydration. Not taking/requiring Bentyl.   Review of systems:     No chest pain, no SOB, no fevers, no urinary sx   Past Medical History:  Diagnosis Date  . Allergy   . Asthma    Athletic induced  . Chronic headaches   . GERD (gastroesophageal reflux disease)     Patient's surgical history, family medical history, social history, medications and allergies were all reviewed in Epic    Current Outpatient Medications  Medication Sig Dispense Refill  . cetirizine (ZYRTEC) 10 MG tablet Take 10 mg by mouth daily.    . fluticasone (FLONASE) 50 MCG/ACT nasal spray Place 2 sprays into both nostrils daily as needed.    . Lactobacillus Rhamnosus, GG, (CULTURELLE FOR KIDS PO) Take 1 tablet by mouth daily.    . pantoprazole (PROTONIX) 40 MG tablet Take 1 tablet (40 mg total) by mouth 2 (two) times daily. For 8 weeks 124 tablet 0  . dicyclomine (BENTYL) 10 MG capsule Take 1 capsule (10 mg total) by mouth 3 (three) times daily before meals. (Patient not taking:  Reported on 11/15/2020) 90 capsule 0   Current Facility-Administered Medications  Medication Dose Route Frequency Provider Last Rate Last Admin  . 0.9 %  sodium chloride infusion  500 mL Intravenous Once Heidi Lemay V, DO        Physical Exam:     BP 118/80   Pulse 78   Ht 6' (1.829 m)   Wt 179 lb 8 oz (81.4 kg)   BMI  24.34 kg/m   GENERAL:  Pleasant male in NAD PSYCH: : Cooperative, normal affect Musculoskeletal:  Normal muscle tone, normal strength NEURO: Alert and oriented x 3, no focal neurologic deficits   IMPRESSION and PLAN:    1) GERD with erosive esophagitis 2) Hiatal hernia 3) Esophageal dysmotility  - Continue Protonix 40 mg bid x8 weeks, then trial reducing to 40 mg/day.  If breakthrough symptoms, plan for prolonged high-dose PPI - Continue antireflux lifestyle/dietary modifications with avoidance of exacerbating foods - Based on esophageal dysmotility on Esophageal Manometry, not a candidate for TIF - Referral to Dr. Redmond Pulling for consideration of hiatal hernia repair +/- loose toupee or dor procedure  4) IBS-D - Much improved - Increase Citrucel to 2 cap/day and monitor for improvement - Continue good hydration  5) Tubular adenoma - Repeat colonoscopy 10/2021 due to suboptimal bowel preparation  RTC in 6 months or sooner as needed  I spent 35 minutes of time, including in depth chart review, independent review of results as outlined above, communicating results with the patient directly, face-to-face time with the patient, coordinating care, and ordering studies and medications as appropriate, and documentation.          [place] ,DO, FACG 11/15/2020, 10:07 AM

## 2020-11-15 NOTE — Patient Instructions (Signed)
We have sent the following medications to your pharmacy for you to pick up at your convenience:  Pantoprazole  You will received a call from [facility] to schedule an appointment.

## 2022-01-20 NOTE — Progress Notes (Deleted)
Therapist, music at [facility] ?[address], [address] ?[place], [place] [address] ?[phone] ?Fax [phone] ? ?Date:  01/22/2022  ? ?Name:  Shawn Robbins   DOB:  08/03/1976   MRN:  [id number] ? ?PCP:  Darreld Mclean, MD  ? ? ?Chief Complaint: No chief complaint on file. ? ? ?History of Present Illness: ? ?Shawn Robbins is a 46 y.o. very pleasant male patient who presents with the following: ? ?Pt seen today for a CPE ?History of GERD, EIA ?Last visit with myself was in 2017-however he has seen my partner Dr. Nani Ravens in the meantime.  Most recent visit August 2021 for abdominal bloating. ?He has been evaluated by GI, had esophageal manometry December 2021 ? ?Married to [place] ? ?Covid series ?Tetanus ?Flu  ?Colon cancer screening- 2021, due in 2025 ?Labs are due to update  ? ? ?Patient Active Problem List  ? Diagnosis Date Noted  ? Cough   ? Gastroesophageal reflux disease   ? Heartburn   ? Regurgitation of food   ? Chronic abdominal pain 07/03/2020  ? ? ?Past Medical History:  ?Diagnosis Date  ? Allergy   ? Asthma   ? Athletic induced  ? Chronic headaches   ? GERD (gastroesophageal reflux disease)   ? ? ?Past Surgical History:  ?Procedure Laterality Date  ? ESOPHAGEAL MANOMETRY N/A 10/31/2020  ? Procedure: ESOPHAGEAL MANOMETRY (EM);  Surgeon: Lavena Bullion, DO;  Location: [facility];  Service: Gastroenterology;  Laterality: N/A;  ? EYE SURGERY    ? Left eye as a child  ? HERNIA REPAIR    ? ? ?Social History  ? ?Tobacco Use  ? Smoking status: Former  ? Smokeless tobacco: Never  ?Vaping Use  ? Vaping Use: Former  ?Substance Use Topics  ? Alcohol use: Yes  ?  Alcohol/week: 0.0 standard drinks  ?  Comment: weekly   ? Drug use: Yes  ?  Frequency: 7.0 times per week  ?  Types: Marijuana  ? ? ?Family History  ?Problem Relation Age of Onset  ? Heart disease Mother   ? Muscular dystrophy Mother   ? Asthma Mother   ? Diabetes Mother   ? Cancer Father   ? Parkinson's disease Father   ?  Lymphoma Father   ? Colon cancer Neg Hx   ? Stomach cancer Neg Hx   ? Rectal cancer Neg Hx   ? Prostate cancer Neg Hx   ? Pancreatic cancer Neg Hx   ? ? ?No Known Allergies ? ?Medication list has been reviewed and updated. ? ?Current Outpatient Medications on File Prior to Visit  ?Medication Sig Dispense Refill  ? cetirizine (ZYRTEC) 10 MG tablet Take 10 mg by mouth daily.    ? dicyclomine (BENTYL) 10 MG capsule Take 1 capsule (10 mg total) by mouth 3 (three) times daily before meals. (Patient not taking: Reported on 11/15/2020) 90 capsule 0  ? fluticasone (FLONASE) 50 MCG/ACT nasal spray Place 2 sprays into both nostrils daily as needed.    ? Lactobacillus Rhamnosus, GG, (CULTURELLE FOR KIDS PO) Take 1 tablet by mouth daily.    ? pantoprazole (PROTONIX) 40 MG tablet Take 1 tablet (40 mg total) by mouth 2 (two) times daily. For 8 weeks 124 tablet 0  ? pantoprazole (PROTONIX) 40 MG tablet Take 1 tablet (40 mg total) by mouth 2 (two) times daily. 180 tablet 3  ? ?Current Facility-Administered Medications on File Prior to Visit  ?Medication Dose  Route Frequency Provider Last Rate Last Admin  ? 0.9 %  sodium chloride infusion  500 mL Intravenous Once Cirigliano, Vito V, DO      ? ? ?Review of Systems: ? ?As per HPI- otherwise negative. ? ? ?Physical Examination: ?There were no vitals filed for this visit. ?There were no vitals filed for this visit. ?There is no height or weight on file to calculate BMI. ?Ideal Body Weight:   ? ?GEN: no acute distress. ?HEENT: Atraumatic, Normocephalic.  ?Ears and Nose: No external deformity. ?CV: RRR, No M/G/R. No JVD. No thrill. No extra heart sounds. ?PULM: CTA B, no wheezes, crackles, rhonchi. No retractions. No resp. distress. No accessory muscle use. ?ABD: S, NT, ND, +BS. No rebound. No HSM. ?EXTR: No c/c/e ?PSYCH: Normally interactive. Conversant.  ? ? ?Assessment and Plan: ?*** ?Physical exam today ?Encouraged healthy diet and exercise routine ?Will plan further follow- up pending  labs. ? ?Signed ?Lamar Blinks, MD ? ?

## 2022-01-20 NOTE — Patient Instructions (Incomplete)
It was good to see you again today, I will be in touch with your labs as soon as possible ?

## 2022-01-22 ENCOUNTER — Encounter: Payer: BLUE CROSS/BLUE SHIELD | Admitting: Family Medicine

## 2022-01-22 DIAGNOSIS — Z130 Encounter for screening for diseases of the blood and blood-forming organs and certain disorders involving the immune mechanism: Secondary | ICD-10-CM

## 2022-01-22 DIAGNOSIS — Z125 Encounter for screening for malignant neoplasm of prostate: Secondary | ICD-10-CM

## 2022-01-22 DIAGNOSIS — Z13220 Encounter for screening for lipoid disorders: Secondary | ICD-10-CM

## 2022-01-22 DIAGNOSIS — Z131 Encounter for screening for diabetes mellitus: Secondary | ICD-10-CM

## 2022-01-22 DIAGNOSIS — Z0000 Encounter for general adult medical examination without abnormal findings: Secondary | ICD-10-CM

## 2022-01-22 NOTE — Progress Notes (Addendum)
Therapist, [facility] at [employer] ?[address], [address] ?[place], [place] [address] ?336 [phone] ?Fax [phone] ? ?Date:  01/23/2022  ? ?Name:  Shawn Robbins   DOB:  10-Jul-1976   MRN:  [id number] ? ?PCP:  Darreld Mclean, MD  ? ? ?Chief Complaint: Annual Exam (Concerns/ questions: pt has some f/u questions in regard to his tx. With GI. ) ? ? ?History of Present Illness: ? ?Shawn Robbins is a 46 y.o. very pleasant male patient who presents with the following: ? ?Pt seen today for a CPE ?Last seen by myself in 2017- he did see Dr Nani Ravens 8/21 ? ?He was seen by GI last year for chronic gastrointestinal issues ?He was dx with IBS after an upper and lower GI scope ?They are talking about doing surgery for hiatal hernia- his surgeon however did not think this was necessary so they decided to defer ? ?Over the last year he has worked on his diet which has helped a lot- he is taking just one proton pump inhibitor a day now in general ?He props up to sleep which also helps, he now abstains from alcohol ?Shawn Robbins notes he has greatly improved his symptoms.  However if he overeats or consumes "the wrong food" he may pay for it with hours of diarrhea and discomfort.  He also notes that sometimes he can tolerate a certain food without issue, but the next time he eats it may cause problems ? ?Colon done 2021- repeat 5 years  ?Labs - he did eat this am  ?Tetanus vaccine-update today ?Covid series - done ? ?Married to Stites. They have and 46 and 46 yo ?They are into tennis and tai kwon do ?His oldest unfortunately just broke his hand during a board breaking exercise ? ?He works for an [employer]  ? ?Wt Readings from Last 3 Encounters:  ?01/23/22 159 lb 6.4 oz (72.3 kg)  ?11/15/20 179 lb 8 oz (81.4 kg)  ?10/17/20 183 lb (83 kg)  ? ?At home he might weigh 65- he is down to about his college weight with his GI issues ?He does not want to lose any more weight ?No rashes or hives noted  ? ?He tries to stay active-  he gets a lot of walking just day to day, does about 10K steps  ?Patient Active Problem List  ? Diagnosis Date Noted  ? Cough   ? Gastroesophageal reflux disease   ? Heartburn   ? Regurgitation of food   ? Chronic abdominal pain 07/03/2020  ? ? ?Past Medical History:  ?Diagnosis Date  ? Allergy   ? Asthma   ? Athletic induced  ? Chronic headaches   ? GERD (gastroesophageal reflux disease)   ? ? ?Past Surgical History:  ?Procedure Laterality Date  ? ESOPHAGEAL MANOMETRY N/A 10/31/2020  ? Procedure: ESOPHAGEAL MANOMETRY (EM);  Surgeon: Lavena Bullion, DO;  Location: [facility];  Service: Gastroenterology;  Laterality: N/A;  ? EYE SURGERY    ? Left eye as a child  ? HERNIA REPAIR    ? ? ?Social History  ? ?Tobacco Use  ? Smoking status: Former  ? Smokeless tobacco: Never  ?Vaping Use  ? Vaping Use: Former  ?Substance Use Topics  ? Alcohol use: Yes  ?  Alcohol/week: 0.0 standard drinks  ?  Comment: weekly   ? Drug use: Yes  ?  Frequency: 7.0 times per week  ?  Types: Marijuana  ? ? ?Family History  ?  Problem Relation Age of Onset  ? Heart disease Mother   ? Muscular dystrophy Mother   ? Asthma Mother   ? Diabetes Mother   ? Cancer Father   ? Parkinson's disease Father   ? Lymphoma Father   ? Colon cancer Neg Hx   ? Stomach cancer Neg Hx   ? Rectal cancer Neg Hx   ? Prostate cancer Neg Hx   ? Pancreatic cancer Neg Hx   ? ? ?No Known Allergies ? ?Medication list has been reviewed and updated. ? ?Current Outpatient Medications on File Prior to Visit  ?Medication Sig Dispense Refill  ? cetirizine (ZYRTEC) 10 MG tablet Take 10 mg by mouth daily.    ? fluticasone (FLONASE) 50 MCG/ACT nasal spray Place 2 sprays into both nostrils daily as needed.    ? Lactobacillus Rhamnosus, GG, (CULTURELLE FOR KIDS PO) Take 1 tablet by mouth daily.    ? pantoprazole (PROTONIX) 40 MG tablet Take 1 tablet (40 mg total) by mouth 2 (two) times daily. 180 tablet 3  ? pantoprazole (PROTONIX) 40 MG tablet Take 1 tablet (40 mg total) by mouth 2  (two) times daily. For 8 weeks 124 tablet 0  ? ?Current Facility-Administered Medications on File Prior to Visit  ?Medication Dose Route Frequency Provider Last Rate Last Admin  ? 0.9 %  sodium chloride infusion  500 mL Intravenous Once Cirigliano, Vito V, DO      ? ? ?Review of Systems: ? ?As per HPI- otherwise negative. ? ? ?Physical Examination: ?Vitals:  ? 01/23/22 1057  ?BP: 134/78  ?Pulse: 72  ?Resp: 18  ?Temp: 98.3 ?F (36.8 ?C)  ?SpO2: 98%  ? ?Vitals:  ? 01/23/22 1057  ?Weight: 159 lb 6.4 oz (72.3 kg)  ?Height: 6' (1.829 m)  ? ?Body mass index is 21.62 kg/m?. ?Ideal Body Weight: Weight in (lb) to have BMI = 25: 183.9 ? ?GEN: no acute distress.  Trim build, looks well  ?HEENT: Atraumatic, Normocephalic.  Bilateral TM wnl, oropharynx normal.  PEERL,EOMI.   ?Ears and Nose: No external deformity. ?CV: RRR, No M/G/R. No JVD. No thrill. No extra heart sounds. ?PULM: CTA B, no wheezes, crackles, rhonchi. No retractions. No resp. distress. No accessory muscle use. ?ABD: S, NT, ND No rebound. No HSM. ?EXTR: No c/c/e ?PSYCH: Normally interactive. Conversant.  ? ? ?Assessment and Plan: ?Physical exam ? ?Screening for deficiency anemia - Plan: CBC ? ?Screening for diabetes mellitus - Plan: Comprehensive metabolic panel, Hemoglobin A1c ? ?Screening for prostate cancer - Plan: PSA ? ?Screening for hyperlipidemia - Plan: Lipid panel ? ?Gastrointestinal food sensitivity - Plan: Ambulatory referral to Allergy ? ?Immunization due - Plan: Tdap vaccine greater than or equal to 7yo IM ? ?Physical exam today ?Encourage healthy diet and exercise routine  ?Will plan further follow- up pending labs. ?Unfortunately Shawn Robbins has been struggling with some gastrointestinal concerns.  He wonders if food allergy testing might help him know what foods to avoid.  Certainly we can make a referral for consultation ?Tetanus given ? ?Signed ?Lamar Blinks, MD ? ?Received patient labs as below, sent message ?Results for orders placed or performed  in visit on 01/23/22  ?CBC  ?Result Value Ref Range  ? WBC 6.9 4.0 - 10.5 K/uL  ? RBC 5.03 4.22 - 5.81 Mil/uL  ? Platelets 282.0 150.0 - 400.0 K/uL  ? Hemoglobin 15.8 13.0 - 17.0 g/dL  ? HCT 46.8 39.0 - 52.0 %  ? MCV 93.0 78.0 - 100.0 fl  ?  MCHC 33.8 30.0 - 36.0 g/dL  ? RDW 12.8 11.5 - 15.5 %  ?Comprehensive metabolic panel  ?Result Value Ref Range  ? Sodium 142 135 - 145 mEq/L  ? Potassium 4.1 3.5 - 5.1 mEq/L  ? Chloride 103 96 - 112 mEq/L  ? CO2 32 19 - 32 mEq/L  ? Glucose, Bld 90 70 - 99 mg/dL  ? BUN 16 6 - 23 mg/dL  ? Creatinine, Ser 0.87 0.40 - 1.50 mg/dL  ? Total Bilirubin 1.0 0.2 - 1.2 mg/dL  ? Alkaline Phosphatase 86 39 - 117 U/L  ? AST 26 0 - 37 U/L  ? ALT 42 0 - 53 U/L  ? Total Protein 7.0 6.0 - 8.3 g/dL  ? Albumin 5.0 3.5 - 5.2 g/dL  ? GFR 103.69 >60.00 mL/min  ? Calcium 9.6 8.4 - 10.5 mg/dL  ?Hemoglobin A1c  ?Result Value Ref Range  ? Hgb A1c MFr Bld 5.2 4.6 - 6.5 %  ?Lipid panel  ?Result Value Ref Range  ? Cholesterol 198 0 - 200 mg/dL  ? Triglycerides 63.0 0.0 - 149.0 mg/dL  ? HDL 57.50 >39.00 mg/dL  ? VLDL 12.6 0.0 - 40.0 mg/dL  ? LDL Cholesterol 128 (H) 0 - 99 mg/dL  ? Total CHOL/HDL Ratio 3   ? NonHDL 140.33   ?PSA  ?Result Value Ref Range  ? PSA 0.78 0.10 - 4.00 ng/mL  ? ? ? ?

## 2022-01-22 NOTE — Patient Instructions (Addendum)
Good to see you- I will be in touch with your labs  ?We will get you seen by allergy- they may have some helpful info about foods you may be sensitive to ?Tetanus given today ?Would suggest a covid booster this year ? ?Take care!  ?

## 2022-01-23 ENCOUNTER — Encounter: Payer: Self-pay | Admitting: Family Medicine

## 2022-01-23 ENCOUNTER — Ambulatory Visit (INDEPENDENT_AMBULATORY_CARE_PROVIDER_SITE_OTHER): Payer: BLUE CROSS/BLUE SHIELD | Admitting: Family Medicine

## 2022-01-23 VITALS — BP 134/78 | HR 72 | Temp 98.3°F | Resp 18 | Ht 72.0 in | Wt 159.4 lb

## 2022-01-23 DIAGNOSIS — Z130 Encounter for screening for diseases of the blood and blood-forming organs and certain disorders involving the immune mechanism: Secondary | ICD-10-CM

## 2022-01-23 DIAGNOSIS — Z131 Encounter for screening for diabetes mellitus: Secondary | ICD-10-CM

## 2022-01-23 DIAGNOSIS — Z23 Encounter for immunization: Secondary | ICD-10-CM

## 2022-01-23 DIAGNOSIS — Z0000 Encounter for general adult medical examination without abnormal findings: Secondary | ICD-10-CM

## 2022-01-23 DIAGNOSIS — Z125 Encounter for screening for malignant neoplasm of prostate: Secondary | ICD-10-CM | POA: Diagnosis not present

## 2022-01-23 DIAGNOSIS — T781XXA Other adverse food reactions, not elsewhere classified, initial encounter: Secondary | ICD-10-CM

## 2022-01-23 DIAGNOSIS — Z13220 Encounter for screening for lipoid disorders: Secondary | ICD-10-CM

## 2022-01-23 LAB — COMPREHENSIVE METABOLIC PANEL
ALT: 42 U/L (ref 0–53)
AST: 26 U/L (ref 0–37)
Albumin: 5 g/dL (ref 3.5–5.2)
Alkaline Phosphatase: 86 U/L (ref 39–117)
BUN: 16 mg/dL (ref 6–23)
CO2: 32 mEq/L (ref 19–32)
Calcium: 9.6 mg/dL (ref 8.4–10.5)
Chloride: 103 mEq/L (ref 96–112)
Creatinine, Ser: 0.87 mg/dL (ref 0.40–1.50)
GFR: 103.69 mL/min (ref >60.00)
Glucose, Bld: 90 mg/dL (ref 70–99)
Potassium: 4.1 mEq/L (ref 3.5–5.1)
Sodium: 142 mEq/L (ref 135–145)
Total Bilirubin: 1 mg/dL (ref 0.2–1.2)
Total Protein: 7 g/dL (ref 6.0–8.3)

## 2022-01-23 LAB — CBC
HCT: 46.8 % (ref 39.0–52.0)
Hemoglobin: 15.8 g/dL (ref 13.0–17.0)
MCHC: 33.8 g/dL (ref 30.0–36.0)
MCV: 93 fl (ref 78.0–100.0)
Platelets: 282 10*3/uL (ref 150.0–400.0)
RBC: 5.03 Mil/uL (ref 4.22–5.81)
RDW: 12.8 % (ref 11.5–15.5)
WBC: 6.9 10*3/uL (ref 4.0–10.5)

## 2022-01-23 LAB — LIPID PANEL
Cholesterol: 198 mg/dL (ref 0–200)
HDL: 57.5 mg/dL (ref >39.00)
LDL Cholesterol: 128 mg/dL — ABNORMAL HIGH (ref 0–99)
NonHDL: 140.33
Total CHOL/HDL Ratio: 3
Triglycerides: 63 mg/dL (ref 0.0–149.0)
VLDL: 12.6 mg/dL (ref 0.0–40.0)

## 2022-01-23 LAB — PSA: PSA: 0.78 ng/mL (ref 0.10–4.00)

## 2022-01-23 LAB — HEMOGLOBIN A1C: Hgb A1c MFr Bld: 5.2 % (ref 4.6–6.5)

## 2022-02-20 ENCOUNTER — Ambulatory Visit: Payer: Self-pay | Admitting: Allergy

## 2022-02-20 ENCOUNTER — Encounter: Payer: Self-pay | Admitting: Family Medicine

## 2022-02-20 DIAGNOSIS — K2100 Gastro-esophageal reflux disease with esophagitis, without bleeding: Secondary | ICD-10-CM

## 2022-02-20 MED ORDER — PANTOPRAZOLE SODIUM 40 MG PO TBEC: 40.0000 mg | DELAYED_RELEASE_TABLET | Freq: Two times a day (BID) | ORAL | 3 refills | Status: DC

## 2022-02-20 NOTE — Telephone Encounter (Signed)
Reill sent

## 2022-02-20 NOTE — Telephone Encounter (Signed)
Medication: pantoprazole (PROTONIX) 40 MG tablet  ? ?Has the patient contacted their pharmacy? No. ? ?Preferred Pharmacy (with phone number or street name):  ?[id number] - [place],  - [address]  ?[address], [place] [address]  ?Phone:  [phone]  Fax:  [phone]  ? ?Agent: Please be advised that RX refills may take up to 3 business days. We ask that you follow-up with your pharmacy.  ?

## 2022-02-21 ENCOUNTER — Telehealth: Payer: Self-pay

## 2022-02-21 ENCOUNTER — Telehealth: Payer: Self-pay | Admitting: Family Medicine

## 2022-02-21 NOTE — Telephone Encounter (Signed)
Medication was sent in on 02/20/22 by PCP. ?Called the patient informed prescription sent in on 02/20/2022.  ?

## 2022-02-21 NOTE — Telephone Encounter (Signed)
Katharina Caper Key: [id number] - PA Case ID: [id number] ?

## 2022-02-21 NOTE — Telephone Encounter (Signed)
Pt called stating that his pantoprazole was flagged for a doctor approval and is unable to be filled until signed off. He also states that [facility] had sent the approval yesterday and today with no word back yet. Pt states he is completely out of the medication and needs that refilled ASAP. He also said he would like a call to indicate that approval has been sent to [facility]. Please Advise.  ?

## 2022-02-25 NOTE — Telephone Encounter (Signed)
Approved on April 21 ?Request Reference Number: [id number]. PANTOPRAZOLE TAB '40MG'$  is approved through 02/22/2023. Your patient may now fill this prescription and it will be covered. ?

## 2022-03-13 ENCOUNTER — Ambulatory Visit: Payer: Self-pay | Admitting: Allergy

## 2022-03-31 NOTE — Progress Notes (Deleted)
New Patient Note  RE: Shawn Robbins MRN: [id number] DOB: 04-12-1976 Date of Office Visit: 04/01/2022  Consult requested by: Darreld Mclean, MD Primary care provider: Darreld Mclean, MD  Chief Complaint: No chief complaint on file.  History of Present Illness: I had the pleasure of seeing Shawn Robbins for initial evaluation at the [facility] on 03/31/2022. He is a 46 y.o. male, who is referred here by Copland, Gay Filler, MD for the evaluation of food sensitivities.  He reports food allergy to ***. The reaction occurred at the age of ***, after he ate *** amount of ***. Symptoms started within *** and was in the form of *** hives, swelling, wheezing, abdominal pain, diarrhea, vomiting. ***Denies any associated cofactors such as exertion, infection, NSAID use, or alcohol consumption. The symptoms lasted for ***. He was evaluated in ED and received ***. Since this episode, he does *** not report other accidental exposures to ***. He does *** not have access to epinephrine autoinjector and *** needed to use it.   Past work up includes: ***. Dietary History: patient has been eating other foods including ***milk, ***eggs, ***peanut, ***treenuts, ***sesame, ***shellfish, ***fish, ***soy, ***wheat, ***meats, ***fruits and ***vegetables.  He reports reading labels and avoiding *** in diet completely. He tolerates ***baked egg and baked milk products.   01/23/2022 PCP visit: "He was seen by GI last year for chronic gastrointestinal issues He was dx with IBS after an upper and lower GI scope They are talking about doing surgery for hiatal hernia- his surgeon however did not think this was necessary so they decided to defer   Over the last year he has worked on his diet which has helped a lot- he is taking just one proton pump inhibitor a day now in general He props up to sleep which also helps, he now abstains from alcohol Shawn Robbins notes he has greatly improved his  symptoms.  However if he overeats or consumes "the wrong food" he may pay for it with hours of diarrhea and discomfort.  He also notes that sometimes he can tolerate a certain food without issue, but the next time he eats it may cause problems"  Assessment and Plan: Shawn Robbins is a 46 y.o. male with: No problem-specific Assessment & Plan notes found for this encounter.  No follow-ups on file.  No orders of the defined types were placed in this encounter.  Lab Orders  No laboratory test(s) ordered today    Other allergy screening: Asthma: {Blank single:19197::"yes","no"} Rhino conjunctivitis: {Blank single:19197::"yes","no"} Food allergy: {Blank single:19197::"yes","no"} Medication allergy: {Blank single:19197::"yes","no"} Hymenoptera allergy: {Blank single:19197::"yes","no"} Urticaria: {Blank single:19197::"yes","no"} Eczema:{Blank single:19197::"yes","no"} History of recurrent infections suggestive of immunodeficency: {Blank single:19197::"yes","no"}  Diagnostics: Spirometry:  Tracings reviewed. His effort: {Blank single:19197::"Good reproducible efforts.","It was hard to get consistent efforts and there is a question as to whether this reflects a maximal maneuver.","Poor effort, data can not be interpreted."} FVC: ***L FEV1: ***L, ***% predicted FEV1/FVC ratio: ***% Interpretation: {Blank single:19197::"Spirometry consistent with mild obstructive disease","Spirometry consistent with moderate obstructive disease","Spirometry consistent with severe obstructive disease","Spirometry consistent with possible restrictive disease","Spirometry consistent with mixed obstructive and restrictive disease","Spirometry uninterpretable due to technique","Spirometry consistent with normal pattern","No overt abnormalities noted given today's efforts"}.  Please see scanned spirometry results for details.  Skin Testing: {Blank single:19197::"Select foods","Environmental allergy panel","Environmental  allergy panel and select foods","Food allergy panel","None","Deferred due to recent antihistamines use"}. *** Results discussed with patient/family.   Past Medical History: Patient Active Problem List   Diagnosis Date  Noted  . Cough   . Gastroesophageal reflux disease   . Heartburn   . Regurgitation of food   . Chronic abdominal pain 07/03/2020   Past Medical History:  Diagnosis Date  . Allergy   . Asthma    Athletic induced  . Chronic headaches   . GERD (gastroesophageal reflux disease)    Past Surgical History: Past Surgical History:  Procedure Laterality Date  . ESOPHAGEAL MANOMETRY N/A 10/31/2020   Procedure: ESOPHAGEAL MANOMETRY (EM);  Surgeon: Lavena Bullion, DO;  Location: [facility];  Service: Gastroenterology;  Laterality: N/A;  . EYE SURGERY     Left eye as a child  . HERNIA REPAIR     Medication List:  Current Outpatient Medications  Medication Sig Dispense Refill  . cetirizine (ZYRTEC) 10 MG tablet Take 10 mg by mouth daily.    . fluticasone (FLONASE) 50 MCG/ACT nasal spray Place 2 sprays into both nostrils daily as needed.    . Lactobacillus Rhamnosus, GG, (CULTURELLE FOR KIDS PO) Take 1 tablet by mouth daily.    . pantoprazole (PROTONIX) 40 MG tablet Take 1 tablet (40 mg total) by mouth 2 (two) times daily. For 8 weeks 124 tablet 0  . pantoprazole (PROTONIX) 40 MG tablet Take 1 tablet (40 mg total) by mouth 2 (two) times daily. 180 tablet 3   Current Facility-Administered Medications  Medication Dose Route Frequency Provider Last Rate Last Admin  . 0.9 %  sodium chloride infusion  500 mL Intravenous Once Cirigliano, Vito V, DO       Allergies: No Known Allergies Social History: Social History   Socioeconomic History  . Marital status: Married    Spouse name: Not on file  . Number of children: Not on file  . Years of education: Not on file  . Highest education level: Not on file  Occupational History  . Not on file  Tobacco Use  . Smoking  status: Former  . Smokeless tobacco: Never  Vaping Use  . Vaping Use: Former  Substance and Sexual Activity  . Alcohol use: Yes    Alcohol/week: 0.0 standard drinks    Comment: weekly   . Drug use: Yes    Frequency: 7.0 times per week    Types: Marijuana  . Sexual activity: Not on file  Other Topics Concern  . Not on file  Social History Narrative  . Not on file   Social Determinants of Health   Financial Resource Strain: Not on file  Food Insecurity: Not on file  Transportation Needs: Not on file  Physical Activity: Not on file  Stress: Not on file  Social Connections: Not on file   Lives in a ***. Smoking: *** Occupation: ***  Environmental HistoryFreight forwarder in the house: Estate agent in the family room: {Blank single:19197::"yes","no"} Carpet in the bedroom: {Blank single:19197::"yes","no"} Heating: {Blank single:19197::"electric","gas","heat pump"} Cooling: {Blank single:19197::"central","window","heat pump"} Pet: {Blank single:19197::"yes ***","no"}  Family History: Family History  Problem Relation Age of Onset  . Heart disease Mother   . Muscular dystrophy Mother   . Asthma Mother   . Diabetes Mother   . Cancer Father   . Parkinson's disease Father   . Lymphoma Father   . Colon cancer Neg Hx   . Stomach cancer Neg Hx   . Rectal cancer Neg Hx   . Prostate cancer Neg Hx   . Pancreatic cancer Neg Hx    Problem  Relation Asthma                                   *** Eczema                                *** Food allergy                          *** Allergic rhino conjunctivitis     ***  Review of Systems  Constitutional:  Negative for appetite change, chills, fever and unexpected weight change.  HENT:  Negative for congestion and rhinorrhea.   Eyes:  Negative for itching.  Respiratory:  Negative for cough, chest tightness, shortness of breath and wheezing.   Cardiovascular:  Negative  for chest pain.  Gastrointestinal:  Negative for abdominal pain.  Genitourinary:  Negative for difficulty urinating.  Skin:  Negative for rash.  Neurological:  Negative for headaches.   Objective: There were no vitals taken for this visit. There is no height or weight on file to calculate BMI. Physical Exam Vitals and nursing note reviewed.  Constitutional:      Appearance: Normal appearance. He is well-developed.  HENT:     Head: Normocephalic and atraumatic.     Right Ear: Tympanic membrane and external ear normal.     Left Ear: Tympanic membrane and external ear normal.     Nose: Nose normal.     Mouth/Throat:     Mouth: Mucous membranes are moist.     Pharynx: Oropharynx is clear.  Eyes:     Conjunctiva/sclera: Conjunctivae normal.  Cardiovascular:     Rate and Rhythm: Normal rate and regular rhythm.     Heart sounds: Normal heart sounds. No murmur heard.   No friction rub. No gallop.  Pulmonary:     Effort: Pulmonary effort is normal.     Breath sounds: Normal breath sounds. No wheezing, rhonchi or rales.  Musculoskeletal:     Cervical back: Neck supple.  Skin:    General: Skin is warm.     Findings: No rash.  Neurological:     Mental Status: He is alert and oriented to person, place, and time.  Psychiatric:        Behavior: Behavior normal.  The plan was reviewed with the patient/family, and all questions/concerned were addressed.  It was my pleasure to see Shawn Robbins today and participate in his care. Please feel free to contact me with any questions or concerns.  Sincerely,  Rexene Alberts, DO Allergy & Immunology  [facility] office: [place] office: [phone]

## 2022-04-01 ENCOUNTER — Ambulatory Visit: Payer: Self-pay | Admitting: Allergy

## 2022-07-10 ENCOUNTER — Encounter: Payer: Self-pay | Admitting: Family Medicine

## 2022-09-22 ENCOUNTER — Encounter: Payer: Self-pay | Admitting: Family Medicine

## 2022-09-22 ENCOUNTER — Ambulatory Visit (HOSPITAL_BASED_OUTPATIENT_CLINIC_OR_DEPARTMENT_OTHER)
Admission: RE | Admit: 2022-09-22 | Discharge: 2022-09-22 | Disposition: A | Discharge status: Home / Self Care | Payer: BLUE CROSS/BLUE SHIELD | Source: Ambulatory Visit | Attending: Family Medicine | Admitting: Family Medicine

## 2022-09-22 ENCOUNTER — Ambulatory Visit (INDEPENDENT_AMBULATORY_CARE_PROVIDER_SITE_OTHER): Payer: BLUE CROSS/BLUE SHIELD | Admitting: Family Medicine

## 2022-09-22 VITALS — BP 120/78 | HR 100 | Temp 100.1°F | Resp 18 | Ht 72.0 in | Wt 163.2 lb

## 2022-09-22 DIAGNOSIS — R051 Acute cough: Secondary | ICD-10-CM | POA: Diagnosis present

## 2022-09-22 DIAGNOSIS — R6889 Other general symptoms and signs: Secondary | ICD-10-CM | POA: Diagnosis present

## 2022-09-22 LAB — POC INFLUENZA A&B (BINAX/QUICKVUE)
Influenza A, POC: NEGATIVE
Influenza B, POC: NEGATIVE

## 2022-09-22 LAB — POC COVID19 BINAXNOW: SARS Coronavirus 2 Ag: NEGATIVE

## 2022-09-22 MED ORDER — BENZONATATE 200 MG PO CAPS: 200.0000 mg | ORAL_CAPSULE | Freq: Two times a day (BID) | ORAL | 0 refills | Status: DC | PRN

## 2022-09-22 MED ORDER — GUAIFENESIN ER 600 MG PO TB12: 1200.0000 mg | ORAL_TABLET | Freq: Two times a day (BID) | ORAL | 2 refills | Status: DC

## 2022-09-22 MED ORDER — AMOXICILLIN-POT CLAVULANATE 875-125 MG PO TABS: 1.0000 | ORAL_TABLET | Freq: Two times a day (BID) | ORAL | 0 refills | Status: AC

## 2022-09-22 NOTE — Patient Instructions (Addendum)
Flu and COVID negative Likely another virus, however given the severity of your symptoms and knowing our office will be closed for Thanksgiving, I will go ahead and send in an antibiotic for you. Try to do another 2 days or so of supportive measures and if not improving, then start the antibiotic.   Over the counter medications that may be helpful for symptoms:  Guaifenesin 1200 mg extended release tabs twice daily, with plenty of water For cough and congestion Brand name: Mucinex   Pseudoephedrine 30 mg, one or two tabs every 4 to 6 hours For sinus congestion Brand name: Sudafed You must get this from the pharmacy counter.  Oxymetazoline nasal spray each morning, one spray in each nostril, for NO MORE THAN 3 days  For nasal and sinus congestion Brand name: Afrin Saline nasal spray or Saline Nasal Irrigation 3-5 times a day For nasal and sinus congestion Brand names: Ocean or AYR Fluticasone nasal spray, one spray in each nostril, each morning (after oxymetazoline and saline, if used) For nasal and sinus congestion Brand name: Flonase Warm salt water gargles  For sore throat Every few hours as needed Alternate ibuprofen 400-600 mg and acetaminophen 1000 mg every 4-6 hours For fever, body aches, headache Brand names: Motrin or Advil and Tylenol Dextromethorphan 12-hour cough version 30 mg every 12 hours  For cough Brand name: Delsym Stop all other cold medications for now (Nyquil, Dayquil, Tylenol Cold, Theraflu, etc) and other non-prescription cough/cold preparations. Many of these have the same ingredients listed above and could cause an overdose of medication.   Herbal treatments that have been shown to be helpful in some patients include: Vitamin C '1000mg'$  per day Vitamin D 4000iU per day Zinc '100mg'$  per day Quercetin 25-'500mg'$  twice a day Melatonin 5-'10mg'$  at bedtime  General Instructions Allow your body to rest Drink PLENTY of fluids Isolate yourself from everyone, even  family, until test results have returned   If you develop severe shortness of breath, uncontrolled fevers, coughing up blood, confusion, chest pain, or signs of dehydration (such as significantly decreased urine amounts or dizziness with standing) please go to the ER.

## 2022-09-22 NOTE — Progress Notes (Signed)
Acute Office Visit  Subjective:     Patient ID: Shawn Robbins, male    DOB: January 07, 1976, 46 y.o.   MRN: [id number]  Chief Complaint  Patient presents with   Sinus Problem    Pt states sxs started last week. Pt states congestion is mostly in his chest and states pain with cough. Pt states having fever, chills, and body aches. Negative COVID test on Saturday    Sinus Problem This is a new problem. The current episode started in the past 7 days (Thursday, 4 days ago). The problem has been waxing and waning since onset. The maximum temperature recorded prior to his arrival was 102 - 102.9 F. The fever has been present for 3 to 4 days. Associated symptoms include chills, congestion, coughing, ear pain, headaches, a sore throat and swollen glands. Pertinent negatives include no diaphoresis, hoarse voice, neck pain, shortness of breath, sinus pressure or sneezing. (Body aches, fatigue, green sputum, sore throat, rib/chest soreness) Treatments tried: Advil. The treatment provided mild relief.   Recently flew home from [place] about a week before symptoms started.    Review of Systems  Constitutional:  Positive for chills. Negative for diaphoresis.  HENT:  Positive for congestion, ear pain and sore throat. Negative for hoarse voice, sinus pressure and sneezing.   Respiratory:  Positive for cough. Negative for shortness of breath.   Musculoskeletal:  Negative for neck pain.  Neurological:  Positive for headaches.  All review of systems negative except what is listed in the HPI      Objective:    BP 120/78 (BP Location: Left Arm, Patient Position: Sitting, Cuff Size: Normal)   Pulse 100   Temp 100.1 F (37.8 C) (Oral)   Resp 18   Ht 6' (1.829 m)   Wt 163 lb 3.2 oz (74 kg)   SpO2 96%   BMI 22.13 kg/m    Physical Exam Vitals reviewed.  Constitutional:      General: He is not in acute distress.    Appearance: Normal appearance. He is not ill-appearing.  HENT:     Right Ear:  Tympanic membrane normal.     Left Ear: Tympanic membrane normal.     Nose: No congestion or rhinorrhea.     Mouth/Throat:     Mouth: Mucous membranes are moist.     Pharynx: Oropharynx is clear.  Eyes:     Conjunctiva/sclera: Conjunctivae normal.     Pupils: Pupils are equal, round, and reactive to light.  Cardiovascular:     Rate and Rhythm: Normal rate and regular rhythm.  Pulmonary:     Effort: Pulmonary effort is normal.     Breath sounds: Normal breath sounds.     Comments: Congested cough Mildly diminished bases Skin:    General: Skin is warm and dry.     Findings: No rash.  Neurological:     General: No focal deficit present.     Mental Status: He is alert and oriented to person, place, and time. Mental status is at baseline.  Psychiatric:        Mood and Affect: Mood normal.        Behavior: Behavior normal.        Thought Content: Thought content normal.        Judgment: Judgment normal.     Results for orders placed or performed in visit on 09/22/22  POC Influenza A&B (Binax test)  Result Value Ref Range   Influenza A, POC Negative Negative  Influenza B, POC Negative Negative  POC COVID-19  Result Value Ref Range   SARS Coronavirus 2 Ag Negative Negative        Assessment & Plan:   Problem List Items Addressed This Visit   None Visit Diagnoses     Flu-like symptoms    -  Primary Flu and COVID negative.  Given persistent fevers despite NSAIDs, mild tachycardia, chest soreness, productive cough with purulent sputum, general malaise - CXR. Will go ahead and add Augmentin (option to watch and wait for another day or two), tessalon, and Mucinex.  Continue supportive measures including rest, hydration, humidifier use, steam showers, warm compresses to sinuses, warm liquids with lemon and honey, and over-the-counter cough, cold, and analgesics as needed.  Patient aware of signs/symptoms requiring further/urgent evaluation.    Relevant Medications    amoxicillin-clavulanate (AUGMENTIN) 875-125 MG tablet   benzonatate (TESSALON) 200 MG capsule   guaiFENesin (MUCINEX) 600 MG 12 hr tablet   Other Relevant Orders   POC Influenza A&B (Binax test) (Completed)   POC COVID-19 (Completed)       Meds ordered this encounter  Medications   amoxicillin-clavulanate (AUGMENTIN) 875-125 MG tablet    Sig: Take 1 tablet by mouth 2 (two) times daily for 10 days.    Dispense:  20 tablet    Refill:  0    Order Specific Question:   Supervising Provider    Answer:   Penni Homans A [[id number]]   benzonatate (TESSALON) 200 MG capsule    Sig: Take 1 capsule (200 mg total) by mouth 2 (two) times daily as needed for cough.    Dispense:  20 capsule    Refill:  0    Order Specific Question:   Supervising Provider    Answer:   Penni Homans A [[id number]]   guaiFENesin (MUCINEX) 600 MG 12 hr tablet    Sig: Take 2 tablets (1,200 mg total) by mouth 2 (two) times daily.    Dispense:  30 tablet    Refill:  2    Order Specific Question:   Supervising Provider    Answer:   Penni Homans A [[id number]]    Return if symptoms worsen or fail to improve.  Terrilyn Saver, NP

## 2022-09-24 ENCOUNTER — Encounter: Payer: Self-pay | Admitting: Family Medicine

## 2022-09-28 NOTE — Progress Notes (Deleted)
[place] [facility] [address] [place], [place] [address] [phone]  Date:  09/29/2022   Name:  Shawn Robbins   DOB:  08/06/1976   MRN:  [id number]  PCP:  Darreld Mclean, MD    Chief Complaint: No chief complaint on file.   History of Present Illness:  Shawn Robbins is a 46 y.o. very pleasant male patient who presents with the following:  Patient seen today for follow-up, notes he is still ill from his last visit He was seen by my partner Lovena Le back on 11/20 with chest congestion and fever He had tested negative for COVID at home, he was retested for COVID and flu, both negative Lovena Le got a chest x-ray, treat him with Augmentin Chest x-ray dated 11/20 negative  I saw him most recently in March for his routine physical-he does have some GI issues, chronic GERD but otherwise is generally in good health Patient Active Problem List   Diagnosis Date Noted   Cough    Gastroesophageal reflux disease    Heartburn    Regurgitation of food    Chronic abdominal pain 07/03/2020    Past Medical History:  Diagnosis Date   Allergy    Asthma    Athletic induced   Chronic headaches    GERD (gastroesophageal reflux disease)     Past Surgical History:  Procedure Laterality Date   ESOPHAGEAL MANOMETRY N/A 10/31/2020   Procedure: ESOPHAGEAL MANOMETRY (EM);  Surgeon: Lavena Bullion, DO;  Location: [facility];  Service: Gastroenterology;  Laterality: N/A;   EYE SURGERY     Left eye as a child   HERNIA REPAIR      Social History   Tobacco Use   Smoking status: Former   Smokeless tobacco: Never  Vaping Use   Vaping Use: Former  Substance Use Topics   Alcohol use: Yes    Alcohol/week: 0.0 standard drinks of alcohol    Comment: weekly    Drug use: Yes    Frequency: 7.0 times per week    Types: Marijuana    Family History  Problem Relation Age of Onset   Heart disease Mother    Muscular dystrophy  Mother    Asthma Mother    Diabetes Mother    Cancer Father    Parkinson's disease Father    Lymphoma Father    Colon cancer Neg Hx    Stomach cancer Neg Hx    Rectal cancer Neg Hx    Prostate cancer Neg Hx    Pancreatic cancer Neg Hx     No Known Allergies  Medication list has been reviewed and updated.  Current Outpatient Medications on File Prior to Visit  Medication Sig Dispense Refill   amoxicillin-clavulanate (AUGMENTIN) 875-125 MG tablet Take 1 tablet by mouth 2 (two) times daily for 10 days. 20 tablet 0   benzonatate (TESSALON) 200 MG capsule Take 1 capsule (200 mg total) by mouth 2 (two) times daily as needed for cough. 20 capsule 0   cetirizine (ZYRTEC) 10 MG tablet Take 10 mg by mouth daily.     fluticasone (FLONASE) 50 MCG/ACT nasal spray Place 2 sprays into both nostrils daily as needed.     guaiFENesin (MUCINEX) 600 MG 12 hr tablet Take 2 tablets (1,200 mg total) by mouth 2 (two) times daily. 30 tablet 2   Lactobacillus Rhamnosus, GG, (CULTURELLE FOR KIDS PO) Take 1 tablet by mouth daily.  pantoprazole (PROTONIX) 40 MG tablet Take 1 tablet (40 mg total) by mouth 2 (two) times daily. For 8 weeks 124 tablet 0   pantoprazole (PROTONIX) 40 MG tablet Take 1 tablet (40 mg total) by mouth 2 (two) times daily. 180 tablet 3   Current Facility-Administered Medications on File Prior to Visit  Medication Dose Route Frequency Provider Last Rate Last Admin   0.9 %  sodium chloride infusion  500 mL Intravenous Once Cirigliano, [place], DO        Review of Systems:  As per HPI- otherwise negative.   Physical Examination: There were no vitals filed for this visit. There were no vitals filed for this visit. There is no height or weight on file to calculate BMI. Ideal Body Weight:    GEN: no acute distress. HEENT: Atraumatic, Normocephalic.  Ears and Nose: No external deformity. CV: RRR, No M/G/R. No JVD. No thrill. No extra heart sounds. PULM: CTA B, no wheezes,  crackles, rhonchi. No retractions. No resp. distress. No accessory muscle use. ABD: S, NT, ND, +BS. No rebound. No HSM. EXTR: No c/c/e PSYCH: Normally interactive. Conversant.    Assessment and Plan: ***  Signed Lamar Blinks, MD

## 2022-09-29 ENCOUNTER — Ambulatory Visit (HOSPITAL_BASED_OUTPATIENT_CLINIC_OR_DEPARTMENT_OTHER)
Admission: RE | Admit: 2022-09-29 | Discharge: 2022-09-29 | Disposition: A | Discharge status: Home / Self Care | Payer: BLUE CROSS/BLUE SHIELD | Source: Ambulatory Visit | Attending: Family Medicine | Admitting: Family Medicine

## 2022-09-29 ENCOUNTER — Encounter: Payer: Self-pay | Admitting: Family Medicine

## 2022-09-29 ENCOUNTER — Ambulatory Visit: Payer: BLUE CROSS/BLUE SHIELD | Admitting: Family Medicine

## 2022-09-29 ENCOUNTER — Encounter (HOSPITAL_BASED_OUTPATIENT_CLINIC_OR_DEPARTMENT_OTHER): Payer: Self-pay | Admitting: Urology

## 2022-09-29 ENCOUNTER — Emergency Department (HOSPITAL_BASED_OUTPATIENT_CLINIC_OR_DEPARTMENT_OTHER): Payer: BLUE CROSS/BLUE SHIELD

## 2022-09-29 ENCOUNTER — Other Ambulatory Visit: Payer: Self-pay

## 2022-09-29 ENCOUNTER — Ambulatory Visit (INDEPENDENT_AMBULATORY_CARE_PROVIDER_SITE_OTHER): Payer: BLUE CROSS/BLUE SHIELD | Admitting: Family Medicine

## 2022-09-29 ENCOUNTER — Emergency Department (HOSPITAL_BASED_OUTPATIENT_CLINIC_OR_DEPARTMENT_OTHER)
Admission: EM | Admit: 2022-09-29 | Discharge: 2022-09-30 | Disposition: A | Discharge status: Home / Self Care | Payer: BLUE CROSS/BLUE SHIELD | Attending: Emergency Medicine | Admitting: Emergency Medicine

## 2022-09-29 ENCOUNTER — Other Ambulatory Visit: Payer: BLUE CROSS/BLUE SHIELD

## 2022-09-29 VITALS — BP 120/76 | HR 102 | Temp 99.3°F | Resp 12 | Ht 72.0 in | Wt 158.8 lb

## 2022-09-29 DIAGNOSIS — R000 Tachycardia, unspecified: Secondary | ICD-10-CM

## 2022-09-29 DIAGNOSIS — R791 Abnormal coagulation profile: Secondary | ICD-10-CM | POA: Diagnosis not present

## 2022-09-29 DIAGNOSIS — J189 Pneumonia, unspecified organism: Secondary | ICD-10-CM | POA: Diagnosis not present

## 2022-09-29 DIAGNOSIS — M791 Myalgia, unspecified site: Secondary | ICD-10-CM | POA: Insufficient documentation

## 2022-09-29 DIAGNOSIS — I471 Supraventricular tachycardia, unspecified: Secondary | ICD-10-CM

## 2022-09-29 LAB — BASIC METABOLIC PANEL
BUN: 11 mg/dL (ref 6–23)
CO2: 25 mEq/L (ref 19–32)
Calcium: 9 mg/dL (ref 8.4–10.5)
Chloride: 97 mEq/L (ref 96–112)
Creatinine, Ser: 0.91 mg/dL (ref 0.40–1.50)
GFR: 100.79 mL/min (ref >60.00)
Glucose, Bld: 100 mg/dL — ABNORMAL HIGH (ref 70–99)
Potassium: 3.8 mEq/L (ref 3.5–5.1)
Sodium: 136 mEq/L (ref 135–145)

## 2022-09-29 LAB — TROPONIN I (HIGH SENSITIVITY)
High Sens Troponin I: 6 ng/L (ref 2–17)
Troponin I (High Sensitivity): 7 ng/L (ref <18)

## 2022-09-29 LAB — CBC
HCT: 44.2 % (ref 39.0–52.0)
Hemoglobin: 15.3 g/dL (ref 13.0–17.0)
MCHC: 34.7 g/dL (ref 30.0–36.0)
MCV: 91.4 fl (ref 78.0–100.0)
Platelets: 477 10*3/uL — ABNORMAL HIGH (ref 150.0–400.0)
RBC: 4.83 Mil/uL (ref 4.22–5.81)
RDW: 12.9 % (ref 11.5–15.5)
WBC: 13.2 10*3/uL — ABNORMAL HIGH (ref 4.0–10.5)

## 2022-09-29 LAB — D-DIMER, QUANTITATIVE: D-Dimer, Quant: 0.9 mcg/mL FEU — ABNORMAL HIGH (ref <0.50)

## 2022-09-29 LAB — SEDIMENTATION RATE: Sed Rate: 53 mm/h — ABNORMAL HIGH (ref 0–15)

## 2022-09-29 MED ORDER — IOHEXOL 350 MG/ML SOLN
75.0000 mL | Freq: Once | INTRAVENOUS | Status: AC | PRN
  Administered 2022-09-29: 75 mL via INTRAVENOUS

## 2022-09-29 NOTE — Progress Notes (Signed)
[place] at [facility] [address], [place], [place] [address] [phone]  Date:  09/29/2022   Name:  Shawn Robbins   DOB:  [birth year]   MRN:  [id number]  PCP:  Darreld Mclean, MD    Chief Complaint: still coughing, Tachycardia, and Diarrhea (constant)   History of Present Illness:  Shawn Robbins is a 46 y.o. very pleasant male patient who presents with the following:  Seen today for follow up from recent illness. Generally in very good health except IBS Seen by my partner Shawn Jobs NP on 11/20 with flu like illness  He contacted me today with concern of tachycardia to about 100 and getting winded more easily  He notes sx he has been sick for at least 10 days now He is getting better, but he was concerned about tachycardia  He did not take any fever reducer today so far  He is still taking augmentin  He is still coughing, sometimes productive   He notes he ran up 2 flights of stairs at home as he was in a rush to answer the phone- his pulse went to 140 by his Apple watch and he felt really out of breath.  His pulse has been about 90- 100 the last few days at rest He is trying to hydrate well  He is still having one "gigantic" loose stool in the am but otherwise diarrhea is resolved  No vomiting  No chest pain   From last visit:  Covid, flu negative Mild tachycardia Chest film negative 09/22/22 Narrative & Impression  CLINICAL DATA:  Cough and fever   EXAM: CHEST - 2 VIEW   COMPARISON:  None Available.   FINDINGS: The heart size and mediastinal contours are within normal limits. Both lungs are clear. No pneumothorax or pleural effusion. The visualized skeletal structures are unremarkable. IMPRESSION: No active cardiopulmonary disease.    Patient Active Problem List   Diagnosis Date Noted   Cough    Gastroesophageal reflux disease    Heartburn    Regurgitation of food    Chronic abdominal pain  07/03/2020    Past Medical History:  Diagnosis Date   Allergy    Asthma    Athletic induced   Chronic headaches    GERD (gastroesophageal reflux disease)     Past Surgical History:  Procedure Laterality Date   ESOPHAGEAL MANOMETRY N/A 10/31/2020   Procedure: ESOPHAGEAL MANOMETRY (EM);  Surgeon: Lavena Bullion, DO;  Location: [facility];  Service: Gastroenterology;  Laterality: N/A;   EYE SURGERY     Left eye as a child   HERNIA REPAIR      Social History   Tobacco Use   Smoking status: Former   Smokeless tobacco: Never  Vaping Use   Vaping Use: Former  Substance Use Topics   Alcohol use: Yes    Alcohol/week: 0.0 standard drinks of alcohol    Comment: weekly    Drug use: Yes    Frequency: 7.0 times per week    Types: Marijuana    Family History  Problem Relation Age of Onset   Heart disease Mother    Muscular dystrophy Mother    Asthma Mother    Diabetes Mother    Cancer Father    Parkinson's disease Father    Lymphoma Father    Colon cancer Neg Hx    Stomach cancer Neg Hx    Rectal cancer Neg Hx  Prostate cancer Neg Hx    Pancreatic cancer Neg Hx     No Known Allergies  Medication list has been reviewed and updated.  Current Outpatient Medications on File Prior to Visit  Medication Sig Dispense Refill   amoxicillin-clavulanate (AUGMENTIN) 875-125 MG tablet Take 1 tablet by mouth 2 (two) times daily for 10 days. 20 tablet 0   benzonatate (TESSALON) 200 MG capsule Take 1 capsule (200 mg total) by mouth 2 (two) times daily as needed for cough. 20 capsule 0   fluticasone (FLONASE) 50 MCG/ACT nasal spray Place 2 sprays into both nostrils daily as needed.     guaiFENesin (MUCINEX) 600 MG 12 hr tablet Take 2 tablets (1,200 mg total) by mouth 2 (two) times daily. 30 tablet 2   pantoprazole (PROTONIX) 40 MG tablet Take 1 tablet (40 mg total) by mouth 2 (two) times daily. 180 tablet 3   Current Facility-Administered Medications on File Prior to Visit   Medication Dose Route Frequency Provider Last Rate Last Admin   0.9 %  sodium chloride infusion  500 mL Intravenous Once Cirigliano, [place], DO        Review of Systems:  As per HPI- otherwise negative.   Physical Examination: Vitals:   09/29/22 1421  BP: 120/76  Pulse: (!) 102  Resp: 12  Temp: 99.3 F (37.4 C)  SpO2: 93%   Vitals:   09/29/22 1421  Weight: 158 lb 12.8 oz (72 kg)  Height: 6' (1.829 m)   Body mass index is 21.54 kg/m. Ideal Body Weight: Weight in (lb) to have BMI = 25: 183.9  GEN: no acute distress. Normal weight, looks well  HEENT: Atraumatic, Normocephalic. Bilateral TM wnl, oropharynx normal.  PEERL,EOMI.   Ears and Nose: No external deformity. CV: RRR, No M/G/R. No JVD. No thrill. No extra heart sounds. PULM: CTA B, no wheezes, crackles, rhonchi. No retractions. No resp. distress. No accessory muscle use. ABD: S, NT, ND. No rebound. No HSM. EXTR: No c/c/e PSYCH: Normally interactive. Conversant.   EKG: mild sinus tach with possible LAE Repeat chest film clear DG Chest 2 View  Result Date: 09/29/2022 CLINICAL DATA:  Cough, tachycardia. EXAM: CHEST - 2 VIEW COMPARISON:  September 22, 2022. FINDINGS: The heart size and mediastinal contours are within normal limits. Both lungs are clear. The visualized skeletal structures are unremarkable. IMPRESSION: No active cardiopulmonary disease. Electronically Signed   By: Marijo Conception M.D.   On: 09/29/2022 15:13    Assessment and Plan: Tachycardia - Plan: EKG 12-Lead, D-Dimer, Quantitative, Troponin I (High Sensitivity), Basic metabolic panel, CBC, Sedimentation rate, DG Chest 2 View  Pt seen today with tachycardia following recent illness He  tested - for covid; however recent covid still possible, concern for PE Work-up pending as above See patient instructions for more details.    Signed Lamar Blinks, MD  Received labs as below, give patient a call Given symptoms, recent illness and positive  D-dimer I am concerned he could have a PE.  I advised him to proceed to the emergency department for CT angiogram this evening, he is in agreement and is on the way to the med center.  I called and gave brief report to the ER physician, appreciate their care of this nice patient Results for orders placed or performed in visit on 09/29/22  D-Dimer, Quantitative  Result Value Ref Range   D-Dimer, Quant 0.90 (H) <0.50 mcg/mL FEU  Basic metabolic panel  Result Value Ref Range   Sodium  136 135 - 145 mEq/L   Potassium 3.8 3.5 - 5.1 mEq/L   Chloride 97 96 - 112 mEq/L   CO2 25 19 - 32 mEq/L   Glucose, Bld 100 (H) 70 - 99 mg/dL   BUN 11 6 - 23 mg/dL   Creatinine, Ser 0.91 0.40 - 1.50 mg/dL   GFR 100.79 >60.00 mL/min   Calcium 9.0 8.4 - 10.5 mg/dL  CBC  Result Value Ref Range   WBC 13.2 (H) 4.0 - 10.5 K/uL   RBC 4.83 4.22 - 5.81 Mil/uL   Platelets 477.0 (H) 150.0 - 400.0 K/uL   Hemoglobin 15.3 13.0 - 17.0 g/dL   HCT 44.2 39.0 - 52.0 %   MCV 91.4 78.0 - 100.0 fl   MCHC 34.7 30.0 - 36.0 g/dL   RDW 12.9 11.5 - 15.5 %  Troponin I (High Sensitivity)  Result Value Ref Range   High Sens Troponin I 6 2 - 17 ng/L   Pulse Readings from Last 3 Encounters:  09/29/22 (!) 102  09/22/22 100  01/23/22 72

## 2022-09-29 NOTE — ED Triage Notes (Signed)
Sent by dr copeland for CT scan  States cough, tachycardia x 2 weeks  D-dimer was elevated and wanted scan to rule out blood clot

## 2022-09-29 NOTE — ED Provider Notes (Signed)
[facility] Provider Note   CSN: [id number] Arrival date & time: 09/29/22  2014     History {Add pertinent medical, surgical, social history, OB history to HPI:1} Chief Complaint  Patient presents with   Elevated Ddimer at PCP    Shawn Robbins is a 46 y.o. male  2 weeks of URI symptoms. Started on 10 days of Augmentin for symptoms. Still has symptoms, but better. Noticed some mild tachycardia. Saw PCP for labs and noticed D-dimer was elevated.   HPI     Home Medications Prior to Admission medications   Medication Sig Start Date End Date Taking? Authorizing Provider  amoxicillin-clavulanate (AUGMENTIN) 875-125 MG tablet Take 1 tablet by mouth 2 (two) times daily for 10 days. 09/22/22 10/02/22  Terrilyn Saver, NP  benzonatate (TESSALON) 200 MG capsule Take 1 capsule (200 mg total) by mouth 2 (two) times daily as needed for cough. 09/22/22   Terrilyn Saver, NP  fluticasone (FLONASE) 50 MCG/ACT nasal spray Place 2 sprays into both nostrils daily as needed. 03/10/14   [provider]  guaiFENesin (MUCINEX) 600 MG 12 hr tablet Take 2 tablets (1,200 mg total) by mouth 2 (two) times daily. 09/22/22   Terrilyn Saver, NP  pantoprazole (PROTONIX) 40 MG tablet Take 1 tablet (40 mg total) by mouth 2 (two) times daily. 02/20/22   Copland, Gay Filler, MD      Allergies    Patient has no known allergies.    Review of Systems   Review of Systems  Physical Exam Updated Vital Signs BP 119/88   Pulse 91   Temp 98.2 F (36.8 C)   Resp 16   Ht 6' (1.829 m)   Wt 72 kg   SpO2 93%   BMI 21.54 kg/m  Physical Exam  ED Results / Procedures / Treatments   Labs (all labs ordered are listed, but only abnormal results are displayed) Labs Reviewed  TROPONIN I (HIGH SENSITIVITY)    EKG None  Radiology CT Angio Chest PE W and/or Wo Contrast  Result Date: 09/29/2022 CLINICAL DATA:  Pulmonary embolism (PE) suspected, high prob EXAM: CT ANGIOGRAPHY  CHEST WITH CONTRAST TECHNIQUE: Multidetector CT imaging of the chest was performed using the standard protocol during bolus administration of intravenous contrast. Multiplanar CT image reconstructions and MIPs were obtained to evaluate the vascular anatomy. RADIATION DOSE REDUCTION: This exam was performed according to the departmental dose-optimization program which includes automated exposure control, adjustment of the mA and/or kV according to patient size and/or use of iterative reconstruction technique. CONTRAST:  74m OMNIPAQUE IOHEXOL 350 MG/ML SOLN COMPARISON:  Chest x-ray 09/29/2022 FINDINGS: Cardiovascular: Satisfactory opacification of the pulmonary arteries to the segmental level. No evidence of pulmonary embolism. The main pulmonary artery is normal in caliber. Normal heart size. No significant pericardial effusion. The thoracic aorta is normal in caliber. No atherosclerotic plaque of the thoracic aorta. No coronary artery calcifications. Mediastinum/Nodes: No enlarged mediastinal, hilar, or axillary lymph nodes. Thyroid gland, trachea, and esophagus demonstrate no significant findings. Small hiatal hernia containing mesenteric fat. Lungs/Pleura: Diffuse, right greater than left, peribronchovascular ground-glass and tree-in-bud nodularity. No pulmonary mass. No pleural effusion. No pneumothorax. Upper Abdomen: No acute abnormality Musculoskeletal: No chest wall abnormality. No suspicious lytic or blastic osseous lesions. No acute displaced fracture. Review of the MIP images confirms the above findings. IMPRESSION: 1. No pulmonary embolus. 2. Findings suggestive of atypical pneumonia. No follow-up needed if patient is low-risk (and has no known or suspected primary  neoplasm). Non-contrast chest CT can be considered in 12 months if patient is high-risk. This recommendation follows the consensus statement: Guidelines for Management of Incidental Pulmonary Nodules Detected on CT Images: From the Fleischner  Society 2017; Radiology 2017; 284:228-243. Electronically Signed   By: Iven Finn M.D.   On: 09/29/2022 21:56   DG Chest 2 View  Result Date: 09/29/2022 CLINICAL DATA:  Cough, tachycardia. EXAM: CHEST - 2 VIEW COMPARISON:  September 22, 2022. FINDINGS: The heart size and mediastinal contours are within normal limits. Both lungs are clear. The visualized skeletal structures are unremarkable. IMPRESSION: No active cardiopulmonary disease. Electronically Signed   By: Marijo Conception M.D.   On: 09/29/2022 15:13    Procedures Procedures  {Document cardiac monitor, telemetry assessment procedure when appropriate:1}  Medications Ordered in ED Medications  iohexol (OMNIPAQUE) 350 MG/ML injection 75 mL (75 mLs Intravenous Contrast Given 09/29/22 2122)    ED Course/ Medical Decision Making/ A&P                           Medical Decision Making Amount and/or Complexity of Data Reviewed Radiology: ordered.  Risk Prescription drug management.   ***  {Document critical care time when appropriate:1} {Document review of labs and clinical decision tools ie heart score, Chads2Vasc2 etc:1}  {Document your independent review of radiology images, and any outside records:1} {Document your discussion with family members, caretakers, and with consultants:1} {Document social determinants of health affecting pt's care:1} {Document your decision making why or why not admission, treatments were needed:1} Final Clinical Impression(s) / ED Diagnoses Final diagnoses:  None    Rx / DC Orders ED Discharge Orders     None

## 2022-09-29 NOTE — Patient Instructions (Signed)
Good to see you- will be in touch with results asap!  If any worsening please let me know- if you are NOT feeling ok- more short of breath, any chest pain, etc- to ER!

## 2022-09-30 ENCOUNTER — Encounter: Payer: Self-pay | Admitting: Family Medicine

## 2022-09-30 MED ORDER — ALBUTEROL SULFATE HFA 108 (90 BASE) MCG/ACT IN AERS: 1.0000 | INHALATION_SPRAY | Freq: Four times a day (QID) | RESPIRATORY_TRACT | 0 refills | Status: AC | PRN

## 2022-09-30 MED ORDER — DOXYCYCLINE HYCLATE 100 MG PO CAPS: 100.0000 mg | ORAL_CAPSULE | Freq: Two times a day (BID) | ORAL | 0 refills | Status: DC

## 2022-09-30 NOTE — ED Notes (Signed)
Pt ambulated around the ed nurses station and the oxygen saturation stayed 96% and the hr was 88

## 2022-09-30 NOTE — Discharge Instructions (Addendum)
You were seen in the ER today for a CT scan. Your CT scan did show atypical pneumonia, however did not show a pulmonary embolism or blood clot in the lungs. I would like for you to keep taking the augmentin, however I am going to start you on doxycycline to take for 5 days as well. You can keep taking the tessalon pearles as needed. I have also sent you in a prescription for an albuterol inhaler as well. Please follow up with your PCP within the next week. If you have any concerns, new or worsening symptoms, please return to the ER for re-evaluation.   Contact a doctor if: You have a fever. You lose sleep because your cough medicine does not help. Get help right away if: You are short of breath and this gets worse. You have more chest pain. Your sickness gets worse. This is very serious if: You are an older adult. Your body's defense system is weak. You cough up blood. These symptoms may be an emergency. Get help right away. Call 911. Do not wait to see if the symptoms will go away. Do not drive yourself to the hospital.

## 2023-01-30 ENCOUNTER — Other Ambulatory Visit: Payer: Self-pay | Admitting: Gastroenterology

## 2023-06-08 ENCOUNTER — Encounter: Payer: Self-pay | Admitting: Family Medicine

## 2023-06-08 ENCOUNTER — Other Ambulatory Visit: Payer: Self-pay | Admitting: Gastroenterology

## 2023-06-08 ENCOUNTER — Other Ambulatory Visit: Payer: Self-pay | Admitting: Family Medicine

## 2023-06-08 MED ORDER — PANTOPRAZOLE SODIUM 40 MG PO TBEC: 40.0000 mg | DELAYED_RELEASE_TABLET | Freq: Two times a day (BID) | ORAL | 0 refills | Status: DC

## 2023-06-09 MED ORDER — PANTOPRAZOLE SODIUM 40 MG PO TBEC: 40.0000 mg | DELAYED_RELEASE_TABLET | Freq: Every day | ORAL | 3 refills | Status: DC

## 2023-07-11 ENCOUNTER — Other Ambulatory Visit: Payer: Self-pay | Admitting: Gastroenterology

## 2023-09-08 ENCOUNTER — Telehealth: Payer: Self-pay | Admitting: Family Medicine

## 2023-09-08 ENCOUNTER — Ambulatory Visit (INDEPENDENT_AMBULATORY_CARE_PROVIDER_SITE_OTHER): Payer: 59 | Admitting: Family

## 2023-09-08 VITALS — BP 134/84 | HR 83 | Resp 18 | Ht 72.0 in | Wt 181.6 lb

## 2023-09-08 DIAGNOSIS — K625 Hemorrhage of anus and rectum: Secondary | ICD-10-CM

## 2023-09-08 DIAGNOSIS — H01003 Unspecified blepharitis right eye, unspecified eyelid: Secondary | ICD-10-CM

## 2023-09-08 DIAGNOSIS — H01006 Unspecified blepharitis left eye, unspecified eyelid: Secondary | ICD-10-CM | POA: Diagnosis not present

## 2023-09-08 MED ORDER — HYDROCORTISONE (PERIANAL) 2.5 % EX CREA: 1.0000 | TOPICAL_CREAM | Freq: Two times a day (BID) | CUTANEOUS | 0 refills | Status: DC

## 2023-09-08 NOTE — Telephone Encounter (Signed)
Appt today w/ Vernona Rieger

## 2023-09-08 NOTE — Progress Notes (Signed)
Shawn Robbins is a 47 y.o. male with the following history as recorded in [employer]:  Patient Active Problem List   Diagnosis Date Noted   Cough    Gastroesophageal reflux disease    Heartburn    Regurgitation of food    Chronic abdominal pain 07/03/2020    Current Outpatient Medications  Medication Sig Dispense Refill   fluticasone (FLONASE) 50 MCG/ACT nasal spray Place 2 sprays into both nostrils daily as needed.     hydrocortisone (ANUSOL-HC) 2.5 % rectal cream Place 1 Application rectally 2 (two) times daily. 30 g 0   pantoprazole (PROTONIX) 40 MG tablet Take 1 tablet (40 mg total) by mouth daily. 90 tablet 3   albuterol (VENTOLIN HFA) 108 (90 Base) MCG/ACT inhaler Inhale 1-2 puffs into the lungs every 6 (six) hours as needed for wheezing or shortness of breath. (Patient not taking: Reported on 09/08/2023) 1 each 0   No current facility-administered medications for this visit.    Allergies: Patient has no known allergies.  Past Medical History:  Diagnosis Date   Allergy    Asthma    Athletic induced   Chronic headaches    GERD (gastroesophageal reflux disease)     Past Surgical History:  Procedure Laterality Date   ESOPHAGEAL MANOMETRY N/A 10/31/2020   Procedure: ESOPHAGEAL MANOMETRY (EM);  Surgeon: Shellia Cleverly, DO;  Location: [facility];  Service: Gastroenterology;  Laterality: N/A;   EYE SURGERY     Left eye as a child   HERNIA REPAIR      Family History  Problem Relation Age of Onset   Heart disease Mother    Muscular dystrophy Mother    Asthma Mother    Diabetes Mother    Cancer Father    Parkinson's disease Father    Lymphoma Father    Colon cancer Neg Hx    Stomach cancer Neg Hx    Rectal cancer Neg Hx    Prostate cancer Neg Hx    Pancreatic cancer Neg Hx     Social History   Tobacco Use   Smoking status: Former   Smokeless tobacco: Never  Substance Use Topics   Alcohol use: Not Currently    Comment: weekly     Subjective:   Patient had  episode of rectal bleeding last night- denies any sense of straining/ sensation of tearing; Did notice some discomfort with bowel movement few days prior though; admits has been eating more junk food recently/ stress level very high;   FHx n/f mother with IBS. No Crohns or UC. No CRC, GI malignancy.   Has been treated for blepharitis twice in the past year; is concerned about link between eyes and possible underlying health issues;     Objective:  Vitals:   09/08/23 1317  BP: 134/84  Pulse: 83  Resp: 18  SpO2: 96%  Weight: 181 lb 9.6 oz (82.4 kg)  Height: 6' (1.829 m)    General: Well developed, well nourished, in no acute distress  Skin : Warm and dry.  Head: Normocephalic and atraumatic  Lungs: Respirations unlabored; clear to auscultation bilaterally without wheeze, rales, rhonchi  CVS exam: normal rate and regular rhythm.  Abdomen: Soft; nontender; nondistended; normoactive bowel sounds; no masses or hepatosplenomegaly  Neurologic: Alert and oriented; speech intact; face symmetrical; moves all extremities well; CNII-XII intact without focal deficit   Assessment:  1. Rectal bleeding   2. Blepharitis of both eyes, unspecified eyelid, unspecified type     Plan:  ? Internal  hemorrhoid- trial of Anusol HC and recommend short term use of stool softeners;  Will check CBC, CMP, CRP, sed rate today; refer back to GI; Also encouraged patient to follow up with his eye doctor to make sure no concerns for other underlying inflammatory issues other than blepharitis.   No follow-ups on file.  Orders Placed This Encounter  Procedures   CBC with Differential/Platelet   Comp Met (CMET)   Sedimentation rate   C-reactive protein   Ambulatory referral to Gastroenterology    Referral Priority:   Routine    Referral Type:   Consultation    Referral Reason:   Specialty Services Required    Referred to Provider:   Shellia Cleverly, DO    Number of Visits Requested:   1    Requested  Prescriptions   Signed Prescriptions Disp Refills   hydrocortisone (ANUSOL-HC) 2.5 % rectal cream 30 g 0    Sig: Place 1 Application rectally 2 (two) times daily.

## 2023-09-08 NOTE — Telephone Encounter (Signed)
FYI- Patient called to advise that he noticed bright red blood in the toilet last night and he's gone three times this morning and it is less blood each time but still there. Transferred to triage.

## 2023-09-08 NOTE — Telephone Encounter (Signed)
Initial Comment Caller states he went to the bathroom yesterday and bright red something in his stool. Translation No Nurse Assessment Nurse: Humfleet, RN, Marchelle Folks Date/Time (Eastern Time): 09/08/2023 8:15:37 AM Confirm and document reason for call. If symptomatic, describe symptoms. ---caller states yesterday evening he went to the bathroom. there was bright red in the stool. it was sticky and he had to clean the bowel. he has IBS. this is still going on this morning. Does the patient have any new or worsening symptoms? ---Yes Will a triage be completed? ---Yes Related visit to physician within the last 2 weeks? ---No Does the PT have any chronic conditions? (i.e. diabetes, asthma, this includes High risk factors for pregnancy, etc.) ---Yes List chronic conditions. ---IBS, acid reflux, allergies Is this a behavioral health or substance abuse call? ---No Guidelines Guideline Title Affirmed Question Affirmed Notes Nurse Date/Time Lamount Cohen Time) Rectal Bleeding MILD rectal bleeding (e.g., more than just a few drops or streaks) Humfleet, RN, Marchelle Folks 09/08/2023 8:18:28 AM Disp. Time Lamount Cohen Time) Disposition Final User 09/08/2023 8:23:45 AM SEE PCP WITHIN 3 DAYS Yes Humfleet, RN, Marchelle Folks PLEASE NOTE: All timestamps contained within this report are represented as [place] Standard Time. CONFIDENTIALTY NOTICE: This fax transmission is intended only for the addressee. It contains information that is legally privileged, confidential or otherwise protected from use or disclosure. If you are not the intended recipient, you are strictly prohibited from reviewing, disclosing, copying using or disseminating any of this information or taking any action in reliance on or regarding this information. If you have received this fax in error, please notify us immediately by telephone so that we can arrange for its return to [place]. Phone: [phone], Toll-Free: [phone], Fax: [phone] Page: 2 of  2 Call Id: [id number] Final Disposition 09/08/2023 8:23:45 AM SEE PCP WITHIN 3 DAYS Yes Humfleet, RN, Earnestine Leys Disagree/Comply Comply Caller Understands Yes PreDisposition Did not know what to do Care Advice Given Per Guideline SEE PCP WITHIN 3 DAYS: * You need to be seen within 2 or 3 days. * PCP VISIT: Call your doctor (or NP/PA) during regular office hours and make an appointment. A clinic or urgent care center are good places to go for care if your doctor's office is closed or you can't get an appointment. NOTE: If office will be open tomorrow, tell caller to call then, not in 3 days. CARE ADVICE given per Rectal Bleeding (Adult) guideline. * You become worse CALL BACK IF: Comments User: Jaclynn Major, RN Date/Time (Eastern Time): 09/08/2023 8:18:51 AM no blood drop in the clothes Referrals REFERRED TO PCP OFFICE

## 2023-09-09 LAB — CBC WITH DIFFERENTIAL/PLATELET
Basophils Absolute: 0 10*3/uL (ref 0.0–0.1)
Basophils Relative: 0.4 % (ref 0.0–3.0)
Eosinophils Absolute: 0 10*3/uL (ref 0.0–0.7)
Eosinophils Relative: 0.6 % (ref 0.0–5.0)
HCT: 46.7 % (ref 39.0–52.0)
Hemoglobin: 15.6 g/dL (ref 13.0–17.0)
Lymphocytes Relative: 34.8 % (ref 12.0–46.0)
Lymphs Abs: 3.1 10*3/uL (ref 0.7–4.0)
MCHC: 33.3 g/dL (ref 30.0–36.0)
MCV: 93.2 fL (ref 78.0–100.0)
Monocytes Absolute: 0.4 10*3/uL (ref 0.1–1.0)
Monocytes Relative: 5 % (ref 3.0–12.0)
Neutro Abs: 5.2 10*3/uL (ref 1.4–7.7)
Neutrophils Relative %: 59.2 % (ref 43.0–77.0)
Platelets: 322 10*3/uL (ref 150.0–400.0)
RBC: 5.02 Mil/uL (ref 4.22–5.81)
RDW: 12.8 % (ref 11.5–15.5)
WBC: 8.8 10*3/uL (ref 4.0–10.5)

## 2023-09-09 LAB — COMPREHENSIVE METABOLIC PANEL
ALT: 54 U/L — ABNORMAL HIGH (ref 0–53)
AST: 26 U/L (ref 0–37)
Albumin: 4.7 g/dL (ref 3.5–5.2)
Alkaline Phosphatase: 94 U/L (ref 39–117)
BUN: 10 mg/dL (ref 6–23)
CO2: 30 meq/L (ref 19–32)
Calcium: 9.5 mg/dL (ref 8.4–10.5)
Chloride: 103 meq/L (ref 96–112)
Creatinine, Ser: 0.98 mg/dL (ref 0.40–1.50)
GFR: 91.61 mL/min (ref >60.00)
Glucose, Bld: 80 mg/dL (ref 70–99)
Potassium: 4.2 meq/L (ref 3.5–5.1)
Sodium: 141 meq/L (ref 135–145)
Total Bilirubin: 0.7 mg/dL (ref 0.2–1.2)
Total Protein: 7 g/dL (ref 6.0–8.3)

## 2023-09-09 LAB — C-REACTIVE PROTEIN: CRP: <1 mg/dL (ref 0.5–20.0)

## 2023-09-09 LAB — SEDIMENTATION RATE: Sed Rate: 1 mm/h (ref 0–15)

## 2023-10-24 NOTE — Progress Notes (Unsigned)
[facility] [address] [place], [place] [address] [phone]  Date:  10/26/2023   Name:  Shawn Robbins   DOB:  July 23, 1976   MRN:  [id number]  PCP:  Pearline Cables, MD    Chief Complaint: URI (chest congestion coughing- started last week.. loss of voice Friday 10/23/23. He has tried Mucunex. )   History of Present Illness:  Shawn Robbins is a 47 y.o. very pleasant male patient who presents with the following:  Patient seen today with concern of chest congestion History of GERD, otherwise generally in good health Most recent visit with myself was November of last year when he had pneumonia  Flu shot-recommended COVID booster-recommended  Sick for about a week- started with chest congestion, he started mucinex Then he started coughing  He is feeling somewhat better today- coughing some but better, overall he feels improved.  Notes he probably would have canceled this appointment but Christmas holiday is this week Laryngitis is improved He is not sure but perhaps some low grade fever last week No body aches or chills No Gi symptoms He did have a ST He did not test for covid or flu at home  His wife has been sick with worse respiratory sx for a month- she just started on abx -he thinks it was amoxicillin Patient Active Problem List   Diagnosis Date Noted   Cough    Gastroesophageal reflux disease    Heartburn    Regurgitation of food    Chronic abdominal pain 07/03/2020    Past Medical History:  Diagnosis Date   Allergy    Asthma    Athletic induced   Chronic headaches    GERD (gastroesophageal reflux disease)     Past Surgical History:  Procedure Laterality Date   ESOPHAGEAL MANOMETRY N/A 10/31/2020   Procedure: ESOPHAGEAL MANOMETRY (EM);  Surgeon: Shellia Cleverly, DO;  Location: [facility];  Service: Gastroenterology;  Laterality: N/A;   EYE SURGERY     Left eye as a child   HERNIA  REPAIR      Social History   Tobacco Use   Smoking status: Former   Smokeless tobacco: Never  Advertising account planner   Vaping status: Former  Substance Use Topics   Alcohol use: Not Currently    Comment: weekly    Drug use: Yes    Types: Marijuana    Comment: occ    Family History  Problem Relation Age of Onset   Heart disease Mother    Muscular dystrophy Mother    Asthma Mother    Diabetes Mother    Cancer Father    Parkinson's disease Father    Lymphoma Father    Colon cancer Neg Hx    Stomach cancer Neg Hx    Rectal cancer Neg Hx    Prostate cancer Neg Hx    Pancreatic cancer Neg Hx     No Known Allergies  Medication list has been reviewed and updated.  Current Outpatient Medications on File Prior to Visit  Medication Sig Dispense Refill   albuterol (VENTOLIN HFA) 108 (90 Base) MCG/ACT inhaler Inhale 1-2 puffs into the lungs every 6 (six) hours as needed for wheezing or shortness of breath. 1 each 0   fluticasone (FLONASE) 50 MCG/ACT nasal spray Place 2 sprays into both nostrils daily as needed.     pantoprazole (PROTONIX) 40 MG tablet Take 1 tablet (40 mg total) by mouth daily. 90  tablet 3   No current facility-administered medications on file prior to visit.    Review of Systems:  As per HPI- otherwise negative.    Physical Examination: Vitals:   10/26/23 1530  BP: 118/84  Pulse: (!) 55  Resp: 18  Temp: 98 F (36.7 C)  SpO2: 98%   Vitals:   10/26/23 1530  Weight: 181 lb 6.4 oz (82.3 kg)  Height: 6' (1.829 m)   Body mass index is 24.6 kg/m. Ideal Body Weight: Weight in (lb) to have BMI = 25: 183.9 GEN: no acute distress.  Normal weight, looks well HEENT: Atraumatic, Normocephalic. Bilateral TM wnl, oropharynx normal.  PEERL,EOMI.   Ears and Nose: No external deformity. CV: RRR, No M/G/R. No JVD. No thrill. No extra heart sounds. PULM: CTA B, no wheezes, crackles, rhonchi. No retractions. No resp. distress. No accessory muscle use. ABD: S, NT, ND,  +BS. No rebound. No HSM. EXTR: No c/c/e PSYCH: Normally interactive. Conversant.     Assessment and Plan: Acute bronchitis, unspecified organism - Plan: azithromycin (ZITHROMAX) 250 MG tablet  Patient seen today with likely viral bronchitis.  He is feeling improved, but Christmas holidays in 2 days so he is concerned about worsening.  I gave him a prescription for azithromycin which she can hold onto.  If symptoms do not continue to improve he can start the medication.  He will contact me if he needs anything else  Signed Abbe Amsterdam, MD

## 2023-10-26 ENCOUNTER — Ambulatory Visit (INDEPENDENT_AMBULATORY_CARE_PROVIDER_SITE_OTHER): Payer: 59 | Admitting: Family Medicine

## 2023-10-26 VITALS — BP 118/84 | HR 55 | Temp 98.0°F | Resp 18 | Ht 72.0 in | Wt 181.4 lb

## 2023-10-26 DIAGNOSIS — J209 Acute bronchitis, unspecified: Secondary | ICD-10-CM | POA: Diagnosis not present

## 2023-10-26 MED ORDER — AZITHROMYCIN 250 MG PO TABS: ORAL_TABLET | ORAL | 0 refills | Status: AC

## 2023-10-26 NOTE — Patient Instructions (Signed)
Good to see you today- I hope you continue to feel better Azithromycin (zpack) in case needed  Recommend flu shot, covid booster this fall once you are well if not done already

## 2024-02-17 ENCOUNTER — Encounter: Payer: Self-pay | Admitting: Family Medicine

## 2024-02-17 DIAGNOSIS — R051 Acute cough: Secondary | ICD-10-CM

## 2024-02-17 MED ORDER — AZITHROMYCIN 250 MG PO TABS: ORAL_TABLET | ORAL | 0 refills | Status: AC

## 2024-02-17 NOTE — Telephone Encounter (Signed)

## 2024-07-02 ENCOUNTER — Other Ambulatory Visit: Payer: Self-pay | Admitting: Family Medicine

## 2024-10-21 ENCOUNTER — Encounter (INDEPENDENT_AMBULATORY_CARE_PROVIDER_SITE_OTHER): Payer: Self-pay | Admitting: Family Medicine

## 2024-10-21 DIAGNOSIS — Z20828 Contact with and (suspected) exposure to other viral communicable diseases: Secondary | ICD-10-CM | POA: Diagnosis not present

## 2024-10-21 MED ORDER — OSELTAMIVIR PHOSPHATE 75 MG PO CAPS: 75.0000 mg | ORAL_CAPSULE | Freq: Every day | ORAL | 0 refills | Status: AC

## 2024-10-21 NOTE — Telephone Encounter (Signed)
